# Patient Record
Sex: Male | Born: 1990 | Race: White | Hispanic: No | Marital: Single | Smoking: Never smoker
Health system: Southern US, Community
[De-identification: ages and names within clinical notes are randomized; demographics above are authoritative.]

## PROBLEM LIST (undated history)

## (undated) DIAGNOSIS — R55 Syncope and collapse: Secondary | ICD-10-CM

## (undated) DIAGNOSIS — J302 Other seasonal allergic rhinitis: Secondary | ICD-10-CM

## (undated) DIAGNOSIS — K429 Umbilical hernia without obstruction or gangrene: Secondary | ICD-10-CM

## (undated) DIAGNOSIS — F1290 Cannabis use, unspecified, uncomplicated: Secondary | ICD-10-CM

## (undated) DIAGNOSIS — I10 Essential (primary) hypertension: Secondary | ICD-10-CM

## (undated) HISTORY — DX: Essential (primary) hypertension: I10

## (undated) HISTORY — DX: Other seasonal allergic rhinitis: J30.2

## (undated) HISTORY — DX: Syncope and collapse: R55

## (undated) HISTORY — DX: Umbilical hernia without obstruction or gangrene: K42.9

## (undated) HISTORY — DX: Cannabis use, unspecified, uncomplicated: F12.90

---
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
## 2011-03-10 LAB — CBC
Hemoglobin: 14
MCHC: 34
RBC: 4.42
WBC: 4.9

## 2011-03-10 LAB — DIFFERENTIAL
Basophils Relative: 0
Lymphocytes Relative: 45
Monocytes Absolute: 0.3
Monocytes Relative: 6
Neutro Abs: 2.3
Neutrophils Relative %: 47

## 2012-08-13 ENCOUNTER — Ambulatory Visit: Payer: Self-pay | Admitting: Orthopedic Surgery

## 2013-06-18 ENCOUNTER — Encounter: Payer: Self-pay | Admitting: General Surgery

## 2013-07-23 ENCOUNTER — Ambulatory Visit: Payer: Self-pay | Admitting: General Surgery

## 2013-08-13 ENCOUNTER — Ambulatory Visit (INDEPENDENT_AMBULATORY_CARE_PROVIDER_SITE_OTHER): Payer: 59 | Admitting: General Surgery

## 2013-08-13 ENCOUNTER — Encounter: Payer: Self-pay | Admitting: General Surgery

## 2013-08-13 VITALS — BP 130/74 | HR 76 | Resp 14 | Ht 75.0 in | Wt 197.0 lb

## 2013-08-13 DIAGNOSIS — K429 Umbilical hernia without obstruction or gangrene: Secondary | ICD-10-CM

## 2013-08-13 NOTE — Patient Instructions (Addendum)
Hernia A hernia occurs when an internal organ pushes out through a weak spot in the abdominal wall. Hernias most commonly occur in the groin and around the navel. Hernias often can be pushed back into place (reduced). Most hernias tend to get worse over time. Some abdominal hernias can get stuck in the opening (irreducible or incarcerated hernia) and cannot be reduced. An irreducible abdominal hernia which is tightly squeezed into the opening is at risk for impaired blood supply (strangulated hernia). A strangulated hernia is a medical emergency. Because of the risk for an irreducible or strangulated hernia, surgery may be recommended to repair a hernia. CAUSES   Heavy lifting.  Prolonged coughing.  Straining to have a bowel movement.  A cut (incision) made during an abdominal surgery. HOME CARE INSTRUCTIONS   Bed rest is not required. You may continue your normal activities.  Avoid lifting more than 10 pounds (4.5 kg) or straining.  Cough gently. If you are a smoker it is best to stop. Even the best hernia repair can break down with the continual strain of coughing. Even if you do not have your hernia repaired, a cough will continue to aggravate the problem.  Do not wear anything tight over your hernia. Do not try to keep it in with an outside bandage or truss. These can damage abdominal contents if they are trapped within the hernia sac.  Eat a normal diet.  Avoid constipation. Straining over long periods of time will increase hernia size and encourage breakdown of repairs. If you cannot do this with diet alone, stool softeners may be used. SEEK IMMEDIATE MEDICAL CARE IF:   You have a fever.  You develop increasing abdominal pain.  You feel nauseous or vomit.  Your hernia is stuck outside the abdomen, looks discolored, feels hard, or is tender.  You have any changes in your bowel habits or in the hernia that are unusual for you.  You have increased pain or swelling around the  hernia.  You cannot push the hernia back in place by applying gentle pressure while lying down. MAKE SURE YOU:   Understand these instructions.  Will watch your condition.  Will get help right away if you are not doing well or get worse. Document Released: 06/06/2005 Document Revised: 08/29/2011 Document Reviewed: 01/24/2008 [facility] Patient Information 2014 [place], [place].    Patient wants to wait to do surgery until the 1st of June due to being in school. He will call back around the 1st of May to schedule this. Patient given telephone pre admit paperwork.

## 2013-08-13 NOTE — Progress Notes (Signed)
Patient ID: Tony Peck, male   DOB: 01/06/1991, 23 y.o.   MRN: [id number]  Chief Complaint  Patient presents with  . Hernia    HPI Tony Peck is a 23 y.o. male.  Here today for evaluation of possible hernia referred by Dr Helane RimaErica Wallace. States he noticed it around Christmas time when he was doing some lifting with decorations. He is a [employer] student. Denies any nausea, vomiting or diarrhea. The pain comes and goes but no pain is constant. States he can feel the bulge. 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  The discomfort is not precluded his being active, although he is staying out of the gym for fear of aggravating the hernia.  HPI  Past Medical History  Diagnosis Date  . Hypertension 2009    Past Surgical History  Procedure Laterality Date  . Tonsillectomy and adenoidectomy  1996  . Nasal sinus surgery  1998    No family history on file.  Social History History  Substance Use Topics  . Smoking status: Never Smoker   . Smokeless tobacco: Not on file  . Alcohol Use: Yes     Comment: occasionally    Allergies  Allergen Reactions  . Adhesive [Tape] Rash    Current Outpatient Prescriptions  Medication Sig Dispense Refill  . Ascorbic Acid (VITAMIN C) 1000 MG tablet Take 1,000 mg by mouth daily.      . cetirizine (ZYRTEC) 10 MG tablet Take 10 mg by mouth daily.       No current facility-administered medications for this visit.    Review of Systems Review of Systems  Constitutional: Negative.   Respiratory: Negative.   Cardiovascular: Negative.   Gastrointestinal: Positive for abdominal pain.    Blood pressure 130/74, pulse 76, resp. rate 14, height 6\' 3"  (1.905 m), weight 197 lb (89.359 kg).  Physical Exam Physical Exam  Constitutional: He is oriented to person, place, and time. He appears well-developed and well-nourished.  Neck: Neck supple.   Cardiovascular: Normal rate, regular rhythm and normal heart sounds.   Pulmonary/Chest: Effort normal and breath sounds normal.  Abdominal: Soft. Normal appearance. A hernia is present.  1 cm pad of fat umbilical area.  Lymphadenopathy:    He has no cervical adenopathy.  Neurological: He is alert and oriented to person, place, and time.  Skin: Skin is warm and dry.     Assessment    Umbilical hernia with incarcerated fat    Plan    Hernia precautions and incarceration were discussed with the patient. If they develop symptoms of an incarcerated hernia, they were encouraged to seek prompt medical attention.  I have recommended repair of the hernia on an outpatient basis when convenient with his schedule. The risk of infection was reviewed.      Patient wants to wait to do surgery until the 1st of June due to being in school. He will call back around the 1st of May to schedule this. Patient given telephone pre admit paperwork.  The patient may participate in activities as he is comfortable. Care with lifting and twisting was encouraged.  Earline MayotteByrnett, Harlyn Rathmann W 08/13/2013, 2:07 PM

## 2013-08-26 ENCOUNTER — Ambulatory Visit: Payer: Self-pay | Admitting: Family Medicine

## 2013-10-28 ENCOUNTER — Telehealth: Payer: Self-pay | Admitting: *Deleted

## 2013-10-28 NOTE — Telephone Encounter (Signed)
Pts mom called and wants to go ahead with scheduling [name] with Dr.Byrnett. He was seen here in the office at the end of February and was told to call back to schedule surgery when he gets out of school. They are going on vacation June 26th and would like to get it done around June 9th or after.

## 2013-10-28 NOTE — Telephone Encounter (Signed)
[facility] He will have a pre op appointment with Dr Lemar LivingsByrnett in the office on 11/13/13 at 4:45 pm. He will pre admit by phone on 11/15/13. Patient is aware of dates, times, and all instructions.

## 2013-11-13 ENCOUNTER — Encounter: Payer: Self-pay | Admitting: General Surgery

## 2013-11-13 ENCOUNTER — Ambulatory Visit (INDEPENDENT_AMBULATORY_CARE_PROVIDER_SITE_OTHER): Payer: 59 | Admitting: General Surgery

## 2013-11-13 VITALS — BP 126/72 | HR 72 | Resp 12 | Ht 75.0 in | Wt 200.0 lb

## 2013-11-13 DIAGNOSIS — K429 Umbilical hernia without obstruction or gangrene: Secondary | ICD-10-CM

## 2013-11-13 NOTE — Patient Instructions (Signed)
Hernia A hernia occurs when an internal organ pushes out through a weak spot in the abdominal wall. Hernias most commonly occur in the groin and around the navel. Hernias often can be pushed back into place (reduced). Most hernias tend to get worse over time. Some abdominal hernias can get stuck in the opening (irreducible or incarcerated hernia) and cannot be reduced. An irreducible abdominal hernia which is tightly squeezed into the opening is at risk for impaired blood supply (strangulated hernia). A strangulated hernia is a medical emergency. Because of the risk for an irreducible or strangulated hernia, surgery may be recommended to repair a hernia. CAUSES   Heavy lifting.  Prolonged coughing.  Straining to have a bowel movement.  A cut (incision) made during an abdominal surgery. HOME CARE INSTRUCTIONS   Bed rest is not required. You may continue your normal activities.  Avoid lifting more than 10 pounds (4.5 kg) or straining.  Cough gently. If you are a smoker it is best to stop. Even the best hernia repair can break down with the continual strain of coughing. Even if you do not have your hernia repaired, a cough will continue to aggravate the problem.  Do not wear anything tight over your hernia. Do not try to keep it in with an outside bandage or truss. These can damage abdominal contents if they are trapped within the hernia sac.  Eat a normal diet.  Avoid constipation. Straining over long periods of time will increase hernia size and encourage breakdown of repairs. If you cannot do this with diet alone, stool softeners may be used. SEEK IMMEDIATE MEDICAL CARE IF:   You have a fever.  You develop increasing abdominal pain.  You feel nauseous or vomit.  Your hernia is stuck outside the abdomen, looks discolored, feels hard, or is tender.  You have any changes in your bowel habits or in the hernia that are unusual for you.  You have increased pain or swelling around the  hernia.  You cannot push the hernia back in place by applying gentle pressure while lying down. MAKE SURE YOU:   Understand these instructions.  Will watch your condition.  Will get help right away if you are not doing well or get worse. Document Released: 06/06/2005 Document Revised: 08/29/2011 Document Reviewed: 01/24/2008 [employer] Patient Information 2014 [employer].  

## 2013-11-13 NOTE — Progress Notes (Signed)
Patient ID: Tony Peck, male   DOB: [birth year], 23 y.o.   MRN: [id number]  Chief Complaint  Patient presents with  . Pre-op Exam    umbilical hernia    HPI Tony Peck is a 23 y.o. male.  Here today to discussion having umbilical hernia surgery November 26 2013. No new complaints.  HPI  Past Medical History  Diagnosis Date  . Hypertension 2009    Past Surgical History  Procedure Laterality Date  . Tonsillectomy and adenoidectomy  1996  . Nasal sinus surgery  1998    No family history on file.  Social History History  Substance Use Topics  . Smoking status: Never Smoker   . Smokeless tobacco: Not on file  . Alcohol Use: Yes     Comment: occasionally    Allergies  Allergen Reactions  . Adhesive [Tape] Rash    Current Outpatient Prescriptions  Medication Sig Dispense Refill  . Ascorbic Acid (VITAMIN C) 1000 MG tablet Take 1,000 mg by mouth daily.      . cetirizine (ZYRTEC) 10 MG tablet Take 10 mg by mouth daily.       No current facility-administered medications for this visit.    Review of Systems Review of Systems  Constitutional: Negative.   Respiratory: Negative.   Cardiovascular: Negative.   Gastrointestinal: Negative for nausea, diarrhea and constipation.    Blood pressure 126/72, pulse 72, resp. rate 12, height 6\' 3"  (1.905 m), weight 200 lb (90.719 kg).  Physical Exam Physical Exam  Constitutional: He is oriented to person, place, and time. He appears well-developed and well-nourished.  Neck: Neck supple.  Cardiovascular: Normal rate, regular rhythm and normal heart sounds.   Pulmonary/Chest: Effort normal and breath sounds normal.  Abdominal: Soft. Normal appearance and bowel sounds are normal. There is tenderness. A hernia is present.  <1 cm area of defect at umbilical area  Lymphadenopathy:    He has no cervical adenopathy.  Neurological: He is alert and oriented to person, place, and time.  Skin: Skin is warm and dry.      Assessment     Umbilical hernia.    Plan    Plans for repair were reviewed.    Hernia precautions and incarceration were discussed with the patient. If they develop symptoms of an incarcerated hernia, they were encouraged to seek prompt medical attention.  I have recommended repair of the hernia on an outpatient basis in the near future. The risk of infection was reviewed. It is unlikely prosthetic mesh will be required.  PCP: Linwood Dibbles Junnie Loschiavo 11/14/2013, 9:58 AM

## 2013-11-14 ENCOUNTER — Other Ambulatory Visit: Payer: Self-pay | Admitting: General Surgery

## 2013-11-14 DIAGNOSIS — K429 Umbilical hernia without obstruction or gangrene: Secondary | ICD-10-CM

## 2013-11-21 ENCOUNTER — Encounter: Payer: Self-pay | Admitting: Family Medicine

## 2013-11-21 ENCOUNTER — Ambulatory Visit (INDEPENDENT_AMBULATORY_CARE_PROVIDER_SITE_OTHER): Payer: 59 | Admitting: Family Medicine

## 2013-11-21 VITALS — BP 170/100 | HR 72 | Temp 98.4°F | Ht 74.0 in | Wt 198.2 lb

## 2013-11-21 DIAGNOSIS — K429 Umbilical hernia without obstruction or gangrene: Secondary | ICD-10-CM

## 2013-11-21 DIAGNOSIS — Z0001 Encounter for general adult medical examination with abnormal findings: Secondary | ICD-10-CM | POA: Insufficient documentation

## 2013-11-21 DIAGNOSIS — J988 Other specified respiratory disorders: Secondary | ICD-10-CM

## 2013-11-21 DIAGNOSIS — I10 Essential (primary) hypertension: Secondary | ICD-10-CM | POA: Insufficient documentation

## 2013-11-21 DIAGNOSIS — Z0000 Encounter for general adult medical examination without abnormal findings: Secondary | ICD-10-CM

## 2013-11-21 DIAGNOSIS — Z113 Encounter for screening for infections with a predominantly sexual mode of transmission: Secondary | ICD-10-CM

## 2013-11-21 DIAGNOSIS — B9789 Other viral agents as the cause of diseases classified elsewhere: Secondary | ICD-10-CM | POA: Insufficient documentation

## 2013-11-21 NOTE — Assessment & Plan Note (Signed)
Should be at acceptable risk to proceed with surgery. Check BMP and EKG today for h/o HTN.

## 2013-11-21 NOTE — Assessment & Plan Note (Signed)
Preventative protocols reviewed and updated unless pt declined. Discussed healthy diet and lifestyle. Safe sex discussed - will return for labwork and STD screen. Encouraged stopping MJ.  Discussed responsible EtOH use

## 2013-11-21 NOTE — Progress Notes (Signed)
Pre visit review using our clinic review tool, if applicable. No additional management support is needed unless otherwise documented below in the visit note. 

## 2013-11-21 NOTE — Progress Notes (Signed)
BP 170/100  Pulse 72  Temp(Src) 98.4 F (36.9 C) (Oral)  Ht 6\' 2"  (1.88 m)  Wt 198 lb 4 oz (89.926 kg)  BMI 25.44 kg/m2  SpO2 99%   CC: new pt to establish care  Subjective:    Patient ID: Tony Peck, male    DOB: Mar 26, 1991, 23 y.o.   MRN: [id number]  HPI: Tony Peck is a 23 y.o. male presenting on 11/21/2013 for Establish Care   Elevated bp today - endorses upset stomach today with diarrhea. Tends to have nervous stomach.  Also nervous about doctor's office - attributes this to elevated bp today. More congested today as well, coughing up phlegm - this started today.  H/o HTN - prior on benazepril 10mg  daily - may have caused fatigue.  Was on this med from 9th grade though 2nd year college.  Changed diet, stopped red meat, avoids salt, drinks plenty of water.  BP has improved with healthy changes.  Has been monitoring bp at home - running 130/75-80.  Pending umbilical hernia surgery with Dr. Birdie Sons 11/26/2013. Area staying sensitive. Denies palpitations, chest pain, exertional dyspnea. No sudden death before age 43 in family.  Has tolerated gen anesthesia in past well.  Currently sexually active, 2 partners in last year, not using protection 100% time.  No recent STD check. Desires screening. asxs.  Preventative: Tdap 01/2006 Meningococcal 11/2009 Varicella 1/2 shots.  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  Relevant past medical, surgical, family and social history reviewed and updated as indicated.  Allergies and medications reviewed and updated. No current outpatient prescriptions on file prior to visit.   No current facility-administered medications on file prior to visit.    Review of Systems  Constitutional: Negative for  fever, chills, activity change, appetite change, fatigue and unexpected weight change.  HENT: Negative for hearing loss.   Eyes: Negative for visual disturbance.  Respiratory: Negative for cough, chest tightness, shortness of breath and wheezing.   Cardiovascular: Negative for chest pain, palpitations and leg swelling.  Gastrointestinal: Positive for abdominal pain (discomfort today) and diarrhea (today). Negative for nausea, vomiting, constipation, blood in stool and abdominal distention.  Genitourinary: Negative for hematuria and difficulty urinating.  Musculoskeletal: Negative for arthralgias, myalgias and neck pain.  Skin: Negative for rash.  Neurological: Negative for dizziness, seizures, syncope and headaches.  Hematological: Negative for adenopathy. Does not bruise/bleed easily.  Psychiatric/Behavioral: Negative for dysphoric mood. The patient is not nervous/anxious.    Per HPI unless specifically indicated above    Objective:    BP 170/100  Pulse 72  Temp(Src) 98.4 F (36.9 C) (Oral)  Ht 6\' 2"  (1.88 m)  Wt 198 lb 4 oz (89.926 kg)  BMI 25.44 kg/m2  SpO2 99%  Physical Exam  Nursing note and vitals reviewed. Constitutional: He is oriented to person, place, and time. He appears well-developed and well-nourished. No distress.  HENT:  Head: Normocephalic and atraumatic.  Right Ear: Hearing, tympanic membrane, external ear and ear canal normal.  Left Ear: Hearing, tympanic membrane, external ear and ear canal normal.  Nose: Nose normal.  Mouth/Throat: Uvula is midline, oropharynx is clear and moist and mucous membranes are normal. No oropharyngeal exudate, posterior oropharyngeal edema or posterior oropharyngeal erythema.  Eyes: Conjunctivae and EOM are normal. Pupils are  equal, round, and reactive to light. No scleral icterus.  Neck: Normal range of motion. Neck supple. No thyromegaly present.  Cardiovascular: Normal rate, regular rhythm, normal heart sounds and intact distal  pulses.   No murmur heard. Pulses:      Radial pulses are 2+ on the right side, and 2+ on the left side.  Pulmonary/Chest: Effort normal. No respiratory distress. He has no wheezes. He has rhonchi (RLL). He has no rales.  Abdominal: Soft. Bowel sounds are normal. He exhibits no distension and no mass. There is no tenderness. There is no rebound and no guarding. A hernia (small umbilical hernia noted) is present.  Musculoskeletal: Normal range of motion. He exhibits no edema.  Lymphadenopathy:    He has no cervical adenopathy.  Neurological: He is alert and oriented to person, place, and time.  CN grossly intact, station and gait intact  Skin: Skin is warm and dry. No rash noted.  Psychiatric: He has a normal mood and affect. His behavior is normal. Judgment and thought content normal.       Assessment & Plan:   Problem List Items Addressed This Visit   Viral respiratory infection     Anticipate current viral resp infection - discussed if cough worsens to let surgery know, may need to postpone surgery. Will recheck on Monday.    Umbilical hernia     Should be at acceptable risk to proceed with surgery. Check BMP and EKG today for h/o HTN.    Hypertension     Check BMP, FLP, TSH, EKG today. Elevated BP in office but pt attributes to nervous stomach and some component of white coat HTN, endorses normal range at home when he checks with his home bp cuff. Has also implemented healthy diet and lifestyle changes to better control known h/o HTN. bp persistently elevated - I have asked him to return on Monday afternoon after work for recheck BP prior to surgery - pt agrees with plan. Baseline EKG - sinus brady 50s, normal axis, intervals, isolated T changes lead III    Relevant Orders      Lipid panel      Basic metabolic panel      EKG 12-Lead (Completed)      TSH   Health maintenance examination - Primary     Preventative protocols reviewed and updated unless pt declined. Discussed  healthy diet and lifestyle. Safe sex discussed - will return for labwork and STD screen. Encouraged stopping MJ.  Discussed responsible EtOH use      Other Visit Diagnoses   Screen for STD (sexually transmitted disease)        Relevant Orders       GC/chlamydia probe amp, urine       HIV antibody       RPR        Follow up plan: Return in about 4 months (around 03/23/2014), or as needed, for follow up visit blood pressure.

## 2013-11-21 NOTE — Assessment & Plan Note (Addendum)
Anticipate current viral resp infection - discussed if cough worsens to let surgery know, may need to postpone surgery. Will recheck on Monday.

## 2013-11-21 NOTE — Patient Instructions (Addendum)
Return tomorrow morning for fasting blood work. Return Monday afternoon for visit to recheck blood pressure. EKG today. Your blood pressure is too high today.

## 2013-11-21 NOTE — Assessment & Plan Note (Addendum)
Check BMP, FLP, TSH, EKG today. Elevated BP in office but pt attributes to nervous stomach and some component of white coat HTN, endorses normal range at home when he checks with his home bp cuff. Has also implemented healthy diet and lifestyle changes to better control known h/o HTN. bp persistently elevated - I have asked him to return on Monday afternoon after work for recheck BP prior to surgery - pt agrees with plan. Baseline EKG - sinus brady 50s, normal axis, intervals, isolated T changes lead III

## 2013-11-22 ENCOUNTER — Encounter: Payer: Self-pay | Admitting: *Deleted

## 2013-11-22 ENCOUNTER — Other Ambulatory Visit (INDEPENDENT_AMBULATORY_CARE_PROVIDER_SITE_OTHER): Payer: 59

## 2013-11-22 DIAGNOSIS — I10 Essential (primary) hypertension: Secondary | ICD-10-CM

## 2013-11-22 DIAGNOSIS — Z113 Encounter for screening for infections with a predominantly sexual mode of transmission: Secondary | ICD-10-CM

## 2013-11-22 LAB — LIPID PANEL
CHOLESTEROL: 131 mg/dL (ref 0–200)
HDL: 46.2 mg/dL (ref >39.00)
LDL CALC: 75 mg/dL (ref 0–99)
NONHDL: 84.8
Total CHOL/HDL Ratio: 3
Triglycerides: 48 mg/dL (ref 0.0–149.0)
VLDL: 9.6 mg/dL (ref 0.0–40.0)

## 2013-11-22 LAB — BASIC METABOLIC PANEL
BUN: 10 mg/dL (ref 6–23)
CO2: 27 mEq/L (ref 19–32)
Calcium: 9.5 mg/dL (ref 8.4–10.5)
Chloride: 104 mEq/L (ref 96–112)
Creatinine, Ser: 0.9 mg/dL (ref 0.4–1.5)
GFR: 111.2 mL/min (ref >60.00)
GLUCOSE: 96 mg/dL (ref 70–99)
POTASSIUM: 4.1 meq/L (ref 3.5–5.1)
SODIUM: 138 meq/L (ref 135–145)

## 2013-11-22 LAB — TSH: TSH: 2.99 u[IU]/mL (ref 0.35–4.50)

## 2013-11-23 LAB — RPR

## 2013-11-23 LAB — HIV ANTIBODY (ROUTINE TESTING W REFLEX): HIV 1&2 Ab, 4th Generation: NONREACTIVE

## 2013-11-23 LAB — GC/CHLAMYDIA PROBE AMP, URINE
Chlamydia, Swab/Urine, PCR: NEGATIVE
GC Probe Amp, Urine: NEGATIVE

## 2013-11-25 ENCOUNTER — Encounter: Payer: Self-pay | Admitting: Family Medicine

## 2013-11-25 ENCOUNTER — Ambulatory Visit (INDEPENDENT_AMBULATORY_CARE_PROVIDER_SITE_OTHER): Payer: 59 | Admitting: Family Medicine

## 2013-11-25 VITALS — BP 166/84 | HR 114 | Temp 98.1°F | Wt 200.2 lb

## 2013-11-25 DIAGNOSIS — I10 Essential (primary) hypertension: Secondary | ICD-10-CM

## 2013-11-25 NOTE — Assessment & Plan Note (Signed)
Here today for recheck bp prior to surgery, but blood pressure staying elevated.  Readings with surgeon were normal, but in our office have been significantly elevated, this in setting of anxiety over upcoming surgery. Recent blood work and EKG reassuring.  I spoke with Dr Birdie Sons to discuss recent elevated bp, anticipate recent elevated readings largely due to anxiety.  May need perioperative antihypertensive therapy. I have requested records from prior pediatric cardiologist Dr. Mary Sella at [facility]. I have advised Kendal to notify me if persistently bp elevated despite after surgery.  Pt able to monitor bp at home.

## 2013-11-25 NOTE — Patient Instructions (Addendum)
Sign release for records from your previous pediatric cardiologist (Dr. Mary Sella at [facility]). I will touch base with Dr. Birdie Sons about your blood pressure around surgery. Monitor blood pressures after surgery - if persistently >140/90 let me know as you will need a medicine for this. Good to see you today.

## 2013-11-25 NOTE — Progress Notes (Signed)
BP 166/84  Pulse 114  Temp(Src) 98.1 F (36.7 C) (Oral)  Wt 200 lb 4 oz (90.833 kg)  SpO2 99%   CC: op check prior to surgery  Subjective:    Patient ID: Tony Peck, male    DOB: 1991/05/09, 23 y.o.   MRN: [id number]  HPI: Tony Peck is a 23 y.o. male presenting on 11/25/2013 for Blood Pressure Check   Quason presents today for a BP recheck prior to scheduled umbilical hernia repair by Dr. Birdie Sons tomorrow. Feels well. No fevers/chills, cough, chest pain, dyspnea.  No more upset stomach. labwork today.  Relevant past medical, surgical, family and social history reviewed and updated as indicated.  Allergies and medications reviewed and updated. Current Outpatient Prescriptions on File Prior to Visit  Medication Sig  . Multiple Vitamins-Minerals (EMERGEN-C VITAMIN C PO) Take by mouth as directed.   No current facility-administered medications on file prior to visit.    Review of Systems Per HPI unless specifically indicated above    Objective:    BP 166/84  Pulse 114  Temp(Src) 98.1 F (36.7 C) (Oral)  Wt 200 lb 4 oz (90.833 kg)  SpO2 99%  Physical Exam  Nursing note and vitals reviewed. Constitutional: He appears well-developed and well-nourished. No distress.  Cardiovascular: Regular rhythm, normal heart sounds and intact distal pulses.  Tachycardia present.   No murmur heard. Pulmonary/Chest: Effort normal and breath sounds normal. No respiratory distress. He has no wheezes. He has no rales.   Results for orders placed in visit on 11/22/13  LIPID PANEL      Result Value Ref Range   Cholesterol 131  0 - 200 mg/dL   Triglycerides 77.8  0.0 - 149.0 mg/dL   HDL 24.23  >53.61 mg/dL   VLDL 9.6  0.0 - 44.3 mg/dL   LDL Cholesterol 75  0 - 99 mg/dL   Total CHOL/HDL Ratio 3     NonHDL 84.80    BASIC METABOLIC PANEL      Result Value Ref Range   Sodium 138  135 - 145 mEq/L   Potassium 4.1  3.5 - 5.1 mEq/L   Chloride 104  96 - 112 mEq/L   CO2 27  19 - 32  mEq/L   Glucose, Bld 96  70 - 99 mg/dL   BUN 10  6 - 23 mg/dL   Creatinine, Ser 0.9  0.4 - 1.5 mg/dL   Calcium 9.5  8.4 - 15.4 mg/dL   GFR 008.67  >61.95 mL/min  GC/CHLAMYDIA PROBE AMP, URINE      Result Value Ref Range   Chlamydia, Swab/Urine, PCR NEGATIVE  NEGATIVE   GC Probe Amp, Urine NEGATIVE  NEGATIVE  HIV ANTIBODY (ROUTINE TESTING)      Result Value Ref Range   HIV 1&2 Ab, 4th Generation NONREACTIVE  NONREACTIVE  RPR      Result Value Ref Range   RPR NON REAC  NON REAC  TSH      Result Value Ref Range   TSH 2.99  0.35 - 4.50 uIU/mL      Assessment & Plan:   Problem List Items Addressed This Visit   Hypertension - Primary     Here today for recheck bp prior to surgery, but blood pressure staying elevated.  Readings with surgeon were normal, but in our office have been significantly elevated, this in setting of anxiety over upcoming surgery. Recent blood work and EKG reassuring.  I spoke with Dr Birdie Sons to discuss recent elevated  bp, anticipate recent elevated readings largely due to anxiety.  May need perioperative antihypertensive therapy. I have requested records from prior pediatric cardiologist Dr. [facility]Kanter at [facility]. I have advised Ladona Ridgelaylor to notify me if persistently bp elevated despite after surgery.  Pt able to monitor bp at home.        Follow up plan: Return as needed.

## 2013-11-25 NOTE — Progress Notes (Signed)
Pre visit review using our clinic review tool, if applicable. No additional management support is needed unless otherwise documented below in the visit note. 

## 2013-11-26 ENCOUNTER — Ambulatory Visit: Payer: Self-pay | Admitting: General Surgery

## 2013-11-26 ENCOUNTER — Encounter: Payer: Self-pay | Admitting: General Surgery

## 2013-11-26 DIAGNOSIS — K429 Umbilical hernia without obstruction or gangrene: Secondary | ICD-10-CM

## 2013-11-26 HISTORY — PX: HERNIA REPAIR: SHX51

## 2013-11-28 ENCOUNTER — Encounter: Payer: Self-pay | Admitting: Family Medicine

## 2013-12-03 ENCOUNTER — Ambulatory Visit (INDEPENDENT_AMBULATORY_CARE_PROVIDER_SITE_OTHER): Payer: 59 | Admitting: General Surgery

## 2013-12-03 ENCOUNTER — Encounter: Payer: Self-pay | Admitting: General Surgery

## 2013-12-03 VITALS — BP 142/80 | HR 82 | Resp 12 | Ht 75.0 in | Wt 194.0 lb

## 2013-12-03 DIAGNOSIS — K429 Umbilical hernia without obstruction or gangrene: Secondary | ICD-10-CM

## 2013-12-03 NOTE — Patient Instructions (Signed)
Patient to return as needed. Proper lifting techniques reviewed. 

## 2013-12-03 NOTE — Progress Notes (Signed)
Patient ID: Tony Peck, male   DOB: Aug 05, 1990, 23 y.o.   MRN: [id number]  Chief Complaint  Patient presents with  . Routine Post Op    umbilical hernia    HPI Tony Hamsaylor Debold is a 23 y.o. male here today for his post op umbilical hernia repair done on 11/26/13. Patient states he is doing well. He is accompanied by his mother who reports a significant amount of pain for the first 3 days after surgery. HPI  Past Medical History  Diagnosis Date  . Hypertension 2009    has seen cardiology  . Seasonal allergies   . Vasovagal syncope 2012    saw cards  . Umbilical hernia   . Marijuana use     Past Surgical History  Procedure Laterality Date  . Tonsillectomy and adenoidectomy  1997  . Nasal sinus surgery  2000  . Myringotomy Bilateral 1998  . Hernia repair  11/26/13    umbilical henira    Family History  Problem Relation Age of Onset  . Hypertension Other     grandparents  . Hypertension Brother   . CAD Maternal Grandfather [id number]    MI  . Kidney disease Maternal Grandfather   . Diabetes Maternal Grandfather   . Cancer Maternal Grandfather     colon    Social History History  Substance Use Topics  . Smoking status: Never Smoker   . Smokeless tobacco: Never Used  . Alcohol Use: Yes     Comment: occasionally    Allergies  Allergen Reactions  . Adhesive [Tape] Rash    Current Outpatient Prescriptions  Medication Sig Dispense Refill  . Multiple Vitamins-Minerals (EMERGEN-C VITAMIN C PO) Take by mouth as directed.       No current facility-administered medications for this visit.    Review of Systems Review of Systems  Constitutional: Negative.   Respiratory: Negative.   Cardiovascular: Negative.     Blood pressure 142/80, pulse 82, resp. rate 12, height 6\' 3"  (1.905 m), weight 194 lb (87.998 kg).  Physical Exam Physical Exam  Constitutional: He is oriented to person, place, and time. He appears well-developed and well-nourished.  Eyes: Conjunctivae are  normal. No scleral icterus.  Neck: Neck supple.  Pulmonary/Chest: Effort normal and breath sounds normal.  Abdominal: Soft. Normal appearance and bowel sounds are normal.  Incision looks clean and healing well.   Neurological: He is alert and oriented to person, place, and time.  Skin: Skin is warm and dry.       Assessment    Doing well status post umbilical hernia repair.     Plan    Activity restrictions were reviewed. He may resume cardio exercises as tolerated. He is to refrain from weightlifting for the next few weeks.  Proper lifting technique was reviewed.    PCP: Deetta PerlaGutierrez, Javier    Emir Nack W 12/03/2013, 8:40 PM

## 2014-09-05 ENCOUNTER — Ambulatory Visit (INDEPENDENT_AMBULATORY_CARE_PROVIDER_SITE_OTHER): Payer: 59 | Admitting: Family Medicine

## 2014-09-05 ENCOUNTER — Encounter: Payer: Self-pay | Admitting: Family Medicine

## 2014-09-05 VITALS — BP 150/90 | HR 116 | Temp 98.9°F | Wt 189.5 lb

## 2014-09-05 DIAGNOSIS — R059 Cough, unspecified: Secondary | ICD-10-CM

## 2014-09-05 DIAGNOSIS — R05 Cough: Secondary | ICD-10-CM

## 2014-09-05 DIAGNOSIS — J111 Influenza due to unidentified influenza virus with other respiratory manifestations: Secondary | ICD-10-CM

## 2014-09-05 LAB — POCT INFLUENZA A/B
INFLUENZA A, POC: POSITIVE
INFLUENZA B, POC: POSITIVE

## 2014-09-05 NOTE — Progress Notes (Signed)
   BP 150/90 mmHg  Pulse 116  Temp(Src) 98.9 F (37.2 C) (Oral)  Wt 189 lb 8 oz (85.957 kg)  SpO2 98%   CC: cough  Subjective:    Patient ID: Tony Peck, male    DOB: February 04, 1991, 24 y.o.   MRN: [id number]  HPI: Tony Peck is a 24 y.o. male presenting on 09/05/2014 for Cough   1d h/o cough productive of green mucous, progressively worsening. Last night with headache, chills, sweating, mild sore throat that today is better. Mild dyspnea and PNDrainage. Some body aches.   No ear or tooth pain.   Took tylenol today. Taking allergy meds.  + sick contacts around - flu and other illnesses.  Did not receive flu shot this year.  No smokers at home. No h/o asthma.  About to go on spring break trip.  Relevant past medical, surgical, family and social history reviewed and updated as indicated. Interim medical history since our last visit reviewed. Allergies and medications reviewed and updated. Current Outpatient Prescriptions on File Prior to Visit  Medication Sig  . Multiple Vitamins-Minerals (EMERGEN-C VITAMIN C PO) Take by mouth as directed.   No current facility-administered medications on file prior to visit.    Review of Systems Per HPI unless specifically indicated above     Objective:    BP 150/90 mmHg  Pulse 116  Temp(Src) 98.9 F (37.2 C) (Oral)  Wt 189 lb 8 oz (85.957 kg)  SpO2 98%  Wt Readings from Last 3 Encounters:  09/05/14 189 lb 8 oz (85.957 kg)  12/03/13 194 lb (87.998 kg)  11/25/13 200 lb 4 oz (90.833 kg)    Physical Exam  Constitutional: He appears well-developed and well-nourished. No distress.  HENT:  Head: Normocephalic and atraumatic.  Right Ear: Hearing, tympanic membrane, external ear and ear canal normal.  Left Ear: Hearing, tympanic membrane, external ear and ear canal normal.  Nose: Mucosal edema (with injection) present. No rhinorrhea. Right sinus exhibits no maxillary sinus tenderness and no frontal sinus tenderness. Left sinus  exhibits no maxillary sinus tenderness and no frontal sinus tenderness.  Mouth/Throat: Uvula is midline, oropharynx is clear and moist and mucous membranes are normal. No oropharyngeal exudate, posterior oropharyngeal edema, posterior oropharyngeal erythema or tonsillar abscesses.  Eyes: Conjunctivae and EOM are normal. Pupils are equal, round, and reactive to light. No scleral icterus.  Neck: Normal range of motion. Neck supple.  Cardiovascular: Regular rhythm, normal heart sounds and intact distal pulses.  Tachycardia present.   No murmur heard. Pulmonary/Chest: Effort normal and breath sounds normal. No respiratory distress. He has no wheezes. He has no rales.  Lymphadenopathy:    He has no cervical adenopathy.  Skin: Skin is warm and dry. No rash noted.  Nursing note and vitals reviewed.      Assessment & Plan:   Problem List Items Addressed This Visit    Influenza - Primary    Flu swab positive Discussed tamiflu - pt opts to decline for now.  Treat with supportive care as per instructions.  Update if not improving with treatment, discussed red flags to seek urgent care.        Other Visit Diagnoses    Cough        Relevant Orders    POCT Influenza A/B (Completed)        Follow up plan: Return if symptoms worsen or fail to improve.

## 2014-09-05 NOTE — Progress Notes (Signed)
Pre visit review using our clinic review tool, if applicable. No additional management support is needed unless otherwise documented below in the visit note. 

## 2014-09-05 NOTE — Assessment & Plan Note (Addendum)
Flu swab positive Discussed tamiflu - pt opts to decline for now.  Treat with supportive care as per instructions.  Update if not improving with treatment, discussed red flags to seek urgent care.

## 2014-09-05 NOTE — Patient Instructions (Addendum)
You have influenza. Treat with tylenol 500-1000mg  twice daily with food. Push fluids and rest. Update us if fever >101 persistent or worsening productive cough. Keep an eye on your blood pressure and let us know if persistently >140/90.  Influenza Influenza ("the flu") is a viral infection of the respiratory tract. It occurs more often in winter months because people spend more time in close contact with one another. Influenza can make you feel very sick. Influenza easily spreads from person to person (contagious). CAUSES  Influenza is caused by a virus that infects the respiratory tract. You can catch the virus by breathing in droplets from an infected person's cough or sneeze. You can also catch the virus by touching something that was recently contaminated with the virus and then touching your mouth, nose, or eyes. RISKS AND COMPLICATIONS You may be at risk for a more severe case of influenza if you smoke cigarettes, have diabetes, have chronic heart disease (such as heart failure) or lung disease (such as asthma), or if you have a weakened immune system. Elderly people and pregnant women are also at risk for more serious infections. The most common problem of influenza is a lung infection (pneumonia). Sometimes, this problem can require emergency medical care and may be life threatening. SIGNS AND SYMPTOMS  Symptoms typically last 4 to 10 days and may include:  Fever.  Chills.  Headache, body aches, and muscle aches.  Sore throat.  Chest discomfort and cough.  Poor appetite.  Weakness or feeling tired.  Dizziness.  Nausea or vomiting. DIAGNOSIS  Diagnosis of influenza is often made based on your history and a physical exam. A nose or throat swab test can be done to confirm the diagnosis. TREATMENT  In mild cases, influenza goes away on its own. Treatment is directed at relieving symptoms. For more severe cases, your health care provider may prescribe antiviral medicines to  shorten the sickness. Antibiotic medicines are not effective because the infection is caused by a virus, not by bacteria. HOME CARE INSTRUCTIONS  Take medicines only as directed by your health care provider.  Use a cool mist humidifier to make breathing easier.  Get plenty of rest until your temperature returns to normal. This usually takes 3 to 4 days.  Drink enough fluid to keep your urine clear or pale yellow.  Cover yourmouth and nosewhen coughing or sneezing,and wash your handswellto prevent thevirusfrom spreading.  Stay homefromwork orschool untilthe fever is gonefor at least 241full day. PREVENTION  An annual influenza vaccination (flu shot) is the best way to avoid getting influenza. An annual flu shot is now routinely recommended for all adults in the [place] SEEK MEDICAL CARE IF:  You experiencechest pain, yourcough worsens,or you producemore mucus.  Youhave nausea,vomiting, ordiarrhea.  Your fever returns or gets worse. SEEK IMMEDIATE MEDICAL CARE IF:  You havetrouble breathing, you become short of breath,or your skin ornails becomebluish.  You have severe painor stiffnessin the neck.  You develop a sudden headache, or pain in the face or ear.  You have nausea or vomiting that you cannot control. MAKE SURE YOU:   Understand these instructions.  Will watch your condition.  Will get help right away if you are not doing well or get worse. Document Released: 06/03/2000 Document Revised: 10/21/2013 Document Reviewed: 09/05/2011 [facility] Patient Information 2015 [employer] [place]. This information is not intended to replace advice given to you by your health care provider. Make sure you discuss any questions you have with your health care provider.

## 2014-10-11 NOTE — Op Note (Signed)
PATIENT NAME:  Tony Peck, Tony Peck MR#:  [id number] DATE OF BIRTH:  [birth year]  DATE OF PROCEDURE:  11/26/2013  PREOPERATIVE DIAGNOSIS: Umbilical hernia.   POSTOPERATIVE DIAGNOSIS: Umbilical hernia.  OPERATIVE PROCEDURE: Repair of umbilical hernia.   SURGEON: Donnalee CurryJeffrey Kili Gracy, MD.   ANESTHESIA: General by LMA under Dr. Pernell DupreAdams, Marcaine 0.5% plain, 30 mL local infiltration, Toradol 30 mg.   ESTIMATED BLOOD LOSS: Minimal.   CLINICAL NOTE: This 24 year old male has an umbilical hernia and was admitted for elective repair.   OPERATIVE NOTE: With the patient under adequate general anesthesia, the abdomen was prepped with ChloraPrep and draped. Hair had previously been removed with clippers. An infraumbilical incision was made and carried down through the skin sharply, and the remaining dissection completed with electrocautery and scissors. The hernia sac was transected at the level of the umbilical skin, excised and discarded. This was tacked back to the fascial defect, which was approximately 1.2 cm in diameter. The undersurface of the fascia was cleared, and interrupted 0 Surgilon simple sutures were placed under direct vision. These were then tied sequentially. The umbilical skin was tacked to the fascia with a 3-0 Vicryl suture. The adipose tissue was approximated with a running 3-0 Vicryl, and the skin closed with running 4-0 Vicryl subcuticular suture. Prior to skin incision, Marcaine was infiltrated for postoperative analgesia and prior to closure 30 mg of Toradol was placed into the wound for postoperative analgesia. Benzoin and Steri-Strips followed by Telfa and Tegaderm dressing was applied. The patient tolerated the procedure well and was taken to the recovery room in stable condition.  ____________________________ Tony MayotteJeffrey W. Gaetana Kawahara, MD jwb:aw D: 11/26/2013 08:28:45 ET T: 11/26/2013 08:45:17 ET JOB#: [id number]  cc: Tony MayotteJeffrey W. Chason Mciver, MD, <Dictator> Eustaquio BoydenJavier Gutierrez, MD Jahlen Bollman Brion AlimentW Aashrith Eves  MD ELECTRONICALLY SIGNED 11/26/2013 20:40

## 2017-06-26 DIAGNOSIS — J209 Acute bronchitis, unspecified: Secondary | ICD-10-CM | POA: Diagnosis not present

## 2017-12-06 ENCOUNTER — Emergency Department
Admission: EM | Admit: 2017-12-06 | Discharge: 2017-12-06 | Disposition: A | Discharge status: Home / Self Care | Payer: 59 | Attending: Student in an Organized Health Care Education/Training Program | Admitting: Student in an Organized Health Care Education/Training Program

## 2017-12-06 ENCOUNTER — Ambulatory Visit: Payer: Self-pay | Admitting: Family Medicine

## 2017-12-06 ENCOUNTER — Other Ambulatory Visit: Payer: Self-pay

## 2017-12-06 DIAGNOSIS — R55 Syncope and collapse: Secondary | ICD-10-CM | POA: Diagnosis not present

## 2017-12-06 DIAGNOSIS — R002 Palpitations: Secondary | ICD-10-CM

## 2017-12-06 DIAGNOSIS — I10 Essential (primary) hypertension: Secondary | ICD-10-CM | POA: Insufficient documentation

## 2017-12-06 DIAGNOSIS — R42 Dizziness and giddiness: Secondary | ICD-10-CM | POA: Diagnosis not present

## 2017-12-06 DIAGNOSIS — Z79899 Other long term (current) drug therapy: Secondary | ICD-10-CM | POA: Insufficient documentation

## 2017-12-06 LAB — CBC
HCT: 43.5 % (ref 40.0–52.0)
Hemoglobin: 15.3 g/dL (ref 13.0–18.0)
MCH: 34.2 pg — AB (ref 26.0–34.0)
MCHC: 35.1 g/dL (ref 32.0–36.0)
MCV: 97.3 fL (ref 80.0–100.0)
PLATELETS: 231 10*3/uL (ref 150–440)
RBC: 4.47 MIL/uL (ref 4.40–5.90)
RDW: 11.8 % (ref 11.5–14.5)
WBC: 6.6 10*3/uL (ref 3.8–10.6)

## 2017-12-06 LAB — BASIC METABOLIC PANEL
Anion gap: 8 (ref 5–15)
BUN: 13 mg/dL (ref 6–20)
CALCIUM: 9.3 mg/dL (ref 8.9–10.3)
CHLORIDE: 104 mmol/L (ref 101–111)
CO2: 26 mmol/L (ref 22–32)
CREATININE: 0.81 mg/dL (ref 0.61–1.24)
GFR calc Af Amer: >60 mL/min (ref >60)
GFR calc non Af Amer: >60 mL/min (ref >60)
Glucose, Bld: 132 mg/dL — ABNORMAL HIGH (ref 65–99)
Potassium: 3.3 mmol/L — ABNORMAL LOW (ref 3.5–5.1)
Sodium: 138 mmol/L (ref 135–145)

## 2017-12-06 LAB — GLUCOSE, CAPILLARY: GLUCOSE-CAPILLARY: 143 mg/dL — AB (ref 65–99)

## 2017-12-06 MED ORDER — LORAZEPAM 0.5 MG PO TABS: 0.5000 mg | ORAL_TABLET | Freq: Three times a day (TID) | ORAL | 0 refills | Status: DC | PRN

## 2017-12-06 NOTE — ED Triage Notes (Addendum)
Near syncope while standing at lunch. Pt states he had BP taken and it was high. Called PCP and referred to ER. Pt ambulatory at this time. States he feels nauseated and fatigued. Pt alert and oriented X4, active, cooperative, pt in NAD. RR even and unlabored, color WNL.    Hx of hypertension, does not take medications anymore.

## 2017-12-06 NOTE — Telephone Encounter (Signed)
Noted  

## 2017-12-06 NOTE — ED Notes (Signed)
Pt ambulatory to POV with family. NAD. VSS. Discharge instructions, RX and follow up discussed. All questions answered.

## 2017-12-06 NOTE — Telephone Encounter (Signed)
He was standing in line at work to get lunch when he almost fainted.   He got dizzy and was seeing double.   His heart started racing.   Someone helped him over to a chair to sit down so he did not fall or actually faint. He is seeing ok now but still feels very weak and "not myself".   My BP was 169/96.   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  See triage notes.  He mentioned is has been having dizzy spells over the last couple of months and wondered if his BP was elevated again.   "I just haven't followed up on it yet but I will now that this has happened".  He has a friend on the way to pick him up.  [place] lives in [place] in [place].  I recommended he be evaluated in the ED since he is still not feeling well and his BP is still elevated.    If my friend can't take me [facility] the ED [facility] then I'll get my mother to [facility].   I live in [place].     I routed a note to Dr. Sharen HonesGutierrez so he would be aware.   Reason for Disposition . [1] Systolic BP  >= 160 OR Diastolic >= 100 AND [2] cardiac or neurologic symptoms (e.g., chest pain, difficulty breathing, unsteady gait, blurred vision)  Answer Assessment - Initial Assessment Questions 1. BLOOD PRESSURE: "What is the blood pressure?" "Did you take at least two measurements 5 minutes apart?"     169/96.   Almost fainted at work.   I was standing in line for lunch all of a sudden I felt dizzy and seeing double.  I had to be helped to sit down.   My heart is racing.   I'm seeing ok now.  This happened in high school.   I have high BP and vasovagal syncope. 2. ONSET: "When did you take your blood pressure?"     BP taken right after I almost fainted.   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   3. HOW: "How did you obtain the blood pressure?" (e.g., visiting nurse, automatic home BP monitor)     Cuff on my arm. 4.  HISTORY: "Do you have a history of high blood pressure?"     Yes       I'm still weak and like headed and the lights are bothering me.   It's been about 45 minutes since it happened. 5. MEDICATIONS: "Are you taking any medications for blood pressure?" "Have you missed any doses recently?"     I'm not on medication for BP now.   They took you off of it 4-5 years ago.   It's been fine without medications. 6. OTHER SYMPTOMS: "Do you have any symptoms?" (e.g., headache, chest pain, blurred vision, difficulty breathing, weakness)     I was a little short of breath.   I've had some dizzy spells over the last couple of months. 7. PREGNANCY: "Is there any chance you are pregnant?" "When was your last menstrual period?"     N/A  Protocols used: HIGH BLOOD PRESSURE-A-AH

## 2017-12-06 NOTE — ED Provider Notes (Signed)
[facility] Emergency Department Provider Note    First MD Initiated Contact with Patient 12/06/17 1521     (approximate)  I have reviewed the triage vital signs and the nursing notes.   HISTORY  Chief Complaint Near Syncope    HPI Tony Peck is a 27 y.o. male with a history of vasovagal syncope presents the ER with near syncopal episode today while he was at work.  Standing and lunch line he started to feel dizzy and felt like he was seeing double.  Felt that he was walking the walls.  He was helped to his office and help to sit down with improvement in his symptoms.  Denies any chest pain or shortness of breath but did feel palpitations that started after the symptoms started.  States he does have a history of anxiety and has been having frequent similar episodes in the past.  Denies any medication use.  Does have significant family history of anxiety.  Denies any numbness or tingling.    Past Medical History:  Diagnosis Date  . Hypertension 2009   has seen cardiology  . Marijuana use   . Seasonal allergies   . Umbilical hernia   . Vasovagal syncope 2012   saw cards   Family History  Problem Relation Age of Onset  . Hypertension Other        grandparents  . Hypertension Brother   . CAD Maternal Grandfather 54       MI  . Kidney disease Maternal Grandfather   . Diabetes Maternal Grandfather   . Cancer Maternal Grandfather        colon   Past Surgical History:  Procedure Laterality Date  . HERNIA REPAIR  11/26/13   umbilical henira  . MYRINGOTOMY Bilateral 1998  . NASAL SINUS SURGERY  2000  . TONSILLECTOMY AND ADENOIDECTOMY  1997   Patient Active Problem List   Diagnosis Date Noted  . Influenza 09/05/2014  . Hypertension 11/21/2013  . Health maintenance examination 11/21/2013  . Umbilical hernia 08/13/2013      Prior to Admission medications   Medication Sig Start Date End Date Taking? Authorizing Provider  cetirizine (ZYRTEC)  10 MG tablet Take 10 mg by mouth daily.    [provider]  Multiple Vitamins-Minerals (EMERGEN-C VITAMIN C PO) Take by mouth as directed.    [provider]    Allergies Adhesive [tape]    Social History Social History   Tobacco Use  . Smoking status: Never Smoker  . Smokeless tobacco: Never Used  Substance Use Topics  . Alcohol use: Yes    Comment: occasionally  . Drug use: Yes    Comment: MJ-occasional    Review of Systems Patient denies headaches, rhinorrhea, blurry vision, numbness, shortness of breath, chest pain, edema, cough, abdominal pain, nausea, vomiting, diarrhea, dysuria, fevers, rashes or hallucinations unless otherwise stated above in HPI. ____________________________________________   PHYSICAL EXAM:  VITAL SIGNS: Vitals:   12/06/17 1441  BP: (!) 194/86  Pulse: 92  Resp: 17  Temp: 97.9 F (36.6 C)  SpO2: 100%    Constitutional: Alert and oriented.  Eyes: Conjunctivae are normal.  Head: Atraumatic. Nose: No congestion/rhinnorhea. Mouth/Throat: Mucous membranes are moist.   Neck: No stridor. Painless ROM.  Cardiovascular: Normal rate, regular rhythm. Grossly normal heart sounds.  Good peripheral circulation. Respiratory: Normal respiratory effort.  No retractions. Lungs CTAB. Gastrointestinal: Soft and nontender. No distention. No abdominal bruits. No CVA tenderness. Genitourinary:  Musculoskeletal: No lower extremity  tenderness nor edema.  No joint effusions. Neurologic:  CN- intact.  No facial droop, Normal FNF.  Normal heel to shin.  Sensation intact bilaterally. Normal speech and language. No gross focal neurologic deficits are appreciated. No gait instability.  Skin:  Skin is warm, dry and intact. No rash noted. Psychiatric: Mood and affect are normal. Speech and behavior are normal.  ____________________________________________   LABS (all labs ordered are listed, but only abnormal results are displayed)  Results for  orders placed or performed during the hospital encounter of 12/06/17 (from the past 24 hour(s))  Basic metabolic panel     Status: Abnormal   Collection Time: 12/06/17  2:52 PM  Result Value Ref Range   Sodium 138 135 - 145 mmol/L   Potassium 3.3 (L) 3.5 - 5.1 mmol/L   Chloride 104 101 - 111 mmol/L   CO2 26 22 - 32 mmol/L   Glucose, Bld 132 (H) 65 - 99 mg/dL   BUN 13 6 - 20 mg/dL   Creatinine, Ser 0.980.81 0.61 - 1.24 mg/dL   Calcium 9.3 8.9 - 11.910.3 mg/dL   GFR calc non Af Amer >60 >60 mL/min   GFR calc Af Amer >60 >60 mL/min   Anion gap 8 5 - 15  CBC     Status: Abnormal   Collection Time: 12/06/17  2:52 PM  Result Value Ref Range   WBC 6.6 3.8 - 10.6 K/uL   RBC 4.47 4.40 - 5.90 MIL/uL   Hemoglobin 15.3 13.0 - 18.0 g/dL   HCT 14.743.5 82.940.0 - 56.252.0 %   MCV 97.3 80.0 - 100.0 fL   MCH 34.2 (H) 26.0 - 34.0 pg   MCHC 35.1 32.0 - 36.0 g/dL   RDW 13.011.8 86.511.5 - 78.414.5 %   Platelets 231 150 - 440 K/uL  Glucose, capillary     Status: Abnormal   Collection Time: 12/06/17  2:52 PM  Result Value Ref Range   Glucose-Capillary 143 (H) 65 - 99 mg/dL   ____________________________________________  EKG My review and personal interpretation at Time: 14:43   Indication: htn  Rate: 75  Rhythm: sinus Axis: normal Other: short pr, no stemi, no brugada ____________________________________________  RADIOLOGY   ____________________________________________   PROCEDURES  Procedure(s) performed:  Procedures    Critical Care performed: no ____________________________________________   INITIAL IMPRESSION / ASSESSMENT AND PLAN / ED COURSE  Pertinent labs & imaging results that were available during my care of the patient were reviewed by me and considered in my medical decision making (see chart for details).   DDX: Dehydration, orthostasis, vasovagal, dysrhythmia, electrolyte abnormality, anemia, seizure, vertigo, anxiety  Tony Peck is a 27 y.o. who presents to the ED with symptoms as  described above.  Patient's neuro exam is nonfocal and reassuring.  He is able to ambulate with steady gait with no symptomatic dizziness.  Is hypertensive upon arrival to the ER but has no signs or symptoms of endorgan damage at this time.  EKG does show shortened PR interval, possibly concerning for the WPW but patient's symptoms started prior to feeling palpitations.  His abdominal exam is soft and benign.  Is tolerating oral hydration.  At this point I do believe patient stable and appropriate for outpatient referral to cardiology for further evaluation of his symptomatology.  Discussed signs and symptoms for which the patient should return immediately to the hospital.      As part of my medical decision making, I reviewed the following data within the electronic medical  record:  Nursing notes reviewed and incorporated, Labs reviewed, notes from prior ED visits.   ____________________________________________   FINAL CLINICAL IMPRESSION(S) / ED DIAGNOSES  Final diagnoses:  Near syncope  Palpitations      NEW MEDICATIONS STARTED DURING THIS VISIT:  New Prescriptions   No medications on file     Note:  This document was prepared using Dragon voice recognition software and may include unintentional dictation errors.    Willy Eddy, MD 12/06/17 [phone]

## 2017-12-12 ENCOUNTER — Encounter: Payer: Self-pay | Admitting: Family Medicine

## 2017-12-12 ENCOUNTER — Ambulatory Visit (INDEPENDENT_AMBULATORY_CARE_PROVIDER_SITE_OTHER): Payer: 59 | Admitting: Family Medicine

## 2017-12-12 VITALS — BP 146/82 | HR 75 | Temp 97.9°F | Ht 74.0 in | Wt 203.0 lb

## 2017-12-12 DIAGNOSIS — I10 Essential (primary) hypertension: Secondary | ICD-10-CM | POA: Diagnosis not present

## 2017-12-12 DIAGNOSIS — R002 Palpitations: Secondary | ICD-10-CM | POA: Insufficient documentation

## 2017-12-12 DIAGNOSIS — R9431 Abnormal electrocardiogram [ECG] [EKG]: Secondary | ICD-10-CM

## 2017-12-12 LAB — TSH: TSH: 2.46 u[IU]/mL (ref 0.35–4.50)

## 2017-12-12 MED ORDER — LORAZEPAM 0.5 MG PO TABS: 0.5000 mg | ORAL_TABLET | Freq: Two times a day (BID) | ORAL | 0 refills | Status: DC | PRN

## 2017-12-12 NOTE — Progress Notes (Signed)
BP (!) 146/82 (BP Location: Right Arm, Cuff Size: Normal)   Pulse 75   Temp 97.9 F (36.6 C) (Oral)   Ht 6\' 2"  (1.88 m)   Wt 203 lb (92.1 kg)   SpO2 99%   BMI 26.06 kg/m   BP Readings from Last 3 Encounters:  12/12/17 (!) 146/82  12/06/17 136/74  09/05/14 (!) 150/90    CC: ER f/u visit Subjective:    Patient ID: Tony Peck, male    DOB: 11/16/1990, 27 y.o.   MRN: [id number]  HPI: Tony Peck is a 27 y.o. male presenting on 12/12/2017 for Hospitalization Follow-up ([facility] ED on 12/06/17, primary dx near syncope. Was given 4 tabs of Ativan in ED. Wants to discuss med. Has appt with cardio tomorrow.)   Last seen 08/2014.  Recent ER visit (note reviewed) for near syncope while at work. [employer], started feeling dizzy while at break room for lunch. Dizziness described as room spinning and lagging vision with head movements, double vision, dimming of lights. Felt palpitations. Some dyspnea with this. No chest pain. BP was 169/94 at office. Labwork at ER revealed K 3.3 otherwise normal (CBC, BMP). BP at ER 194 systolic. EKG showed shortened PR interval - has f/u with cards scheduled for tomorrow (Dr End).   Wasn't feeling anxious when this episode happened.  He did have breakfast and lunch that day.  H/o HTN in high school - took benazepril for several years. Freshman year of college he self stopped medication. He does have fmhx HTN.  BP at home has been running elevated. Some dull headache associated with this.   Finds lorazepam has helped.   Relevant past medical, surgical, family and social history reviewed and updated as indicated. Interim medical history since our last visit reviewed. Allergies and medications reviewed and updated. Outpatient Medications Prior to Visit  Medication Sig Dispense Refill  . cetirizine (ZYRTEC) 10 MG tablet Take 10 mg by mouth daily.    . Multiple Vitamins-Minerals (EMERGEN-C VITAMIN C  PO) Take by mouth as directed.    Marland Kitchen. LORazepam (ATIVAN) 0.5 MG tablet Take 1 tablet (0.5 mg total) by mouth every 8 (eight) hours as needed for anxiety. 4 tablet 0   No facility-administered medications prior to visit.      Per HPI unless specifically indicated in ROS section below Review of Systems     Objective:    BP (!) 146/82 (BP Location: Right Arm, Cuff Size: Normal)   Pulse 75   Temp 97.9 F (36.6 C) (Oral)   Ht 6\' 2"  (1.88 m)   Wt 203 lb (92.1 kg)   SpO2 99%   BMI 26.06 kg/m   Wt Readings from Last 3 Encounters:  12/12/17 203 lb (92.1 kg)  12/06/17 190 lb (86.2 kg)  09/05/14 189 lb 8 oz (86 kg)    Physical Exam  Constitutional: He appears well-developed and well-nourished. No distress.  HENT:  Mouth/Throat: Oropharynx is clear and moist. No oropharyngeal exudate.  Eyes: Pupils are equal, round, and reactive to light. Conjunctivae and EOM are normal.  Neck: No thyromegaly present.  Cardiovascular: Normal rate, regular rhythm and normal heart sounds.  No murmur heard. Pulmonary/Chest: Effort normal and breath sounds normal. No respiratory distress. He has no wheezes. He has no rales.  Musculoskeletal: He exhibits no edema.  Lymphadenopathy:    He has no cervical adenopathy.  Skin: Skin is warm. Capillary refill takes less  than 2 seconds. No rash noted.  Psychiatric: He has a normal mood and affect.  Nursing note and vitals reviewed.  Results for orders placed or performed during the hospital encounter of 12/06/17  Basic metabolic panel  Result Value Ref Range   Sodium 138 135 - 145 mmol/L   Potassium 3.3 (L) 3.5 - 5.1 mmol/L   Chloride 104 101 - 111 mmol/L   CO2 26 22 - 32 mmol/L   Glucose, Bld 132 (H) 65 - 99 mg/dL   BUN 13 6 - 20 mg/dL   Creatinine, Ser 4.09 0.61 - 1.24 mg/dL   Calcium 9.3 8.9 - 81.1 mg/dL   GFR calc non Af Amer >60 >60 mL/min   GFR calc Af Amer >60 >60 mL/min   Anion gap 8 5 - 15  CBC  Result Value Ref Range   WBC 6.6 3.8 - 10.6 K/uL    RBC 4.47 4.40 - 5.90 MIL/uL   Hemoglobin 15.3 13.0 - 18.0 g/dL   HCT 91.4 78.2 - 95.6 %   MCV 97.3 80.0 - 100.0 fL   MCH 34.2 (H) 26.0 - 34.0 pg   MCHC 35.1 32.0 - 36.0 g/dL   RDW 21.3 08.6 - 57.8 %   Platelets 231 150 - 440 K/uL  Glucose, capillary  Result Value Ref Range   Glucose-Capillary 143 (H) 65 - 99 mg/dL   Lab Results  Component Value Date   TSH 2.99 11/22/2013       Assessment & Plan:   Problem List Items Addressed This Visit    Shortened PR interval    borderline      Palpitations - Primary    Describes intermittent tachypalpitations most recent flare associated with presyncope and double vision. EKG at ER showed shortened PR. 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 Check TSH today.  Will refill lorazepam short course - discussed hesitation to treat symptoms with benzo if truly coming from heart. Will await cards eval tomorrow.       Relevant Orders   TSH   Hypertension    Longstanding h/o this, fmhx HTN as well. Treated with benazepril during high school years. Self stopped antihypertensive in college, working on healthy diet and exercise however BP remained borderline elevated. Reviewed goal BP <130/80 and ideally lower. Will likely need to restart antihypertensive therapy, likely beta blocker - will await cards eval tomorrow.           Meds ordered this encounter  Medications  . LORazepam (ATIVAN) 0.5 MG tablet    Sig: Take 1 tablet (0.5 mg total) by mouth 2 (two) times daily as needed for anxiety.    Dispense:  10 tablet    Refill:  0   Orders Placed This Encounter  Procedures  . TSH    Follow up plan: Return if symptoms worsen or fail to improve.  Eustaquio Boyden, MD

## 2017-12-12 NOTE — Assessment & Plan Note (Addendum)
Describes intermittent tachypalpitations most recent flare associated with presyncope and double vision. EKG at ER showed shortened PR. 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 Check TSH today.  Will refill lorazepam short course - discussed hesitation to treat symptoms with benzo if truly coming from heart. Will await cards eval tomorrow.

## 2017-12-12 NOTE — Assessment & Plan Note (Signed)
Longstanding h/o this, fmhx HTN as well. Treated with benazepril during high school years. Self stopped antihypertensive in college, working on healthy diet and exercise however BP remained borderline elevated. Reviewed goal BP <130/80 and ideally lower. Will likely need to restart antihypertensive therapy, likely beta blocker - will await cards eval tomorrow.

## 2017-12-12 NOTE — Patient Instructions (Addendum)
I agree with cardiology evaluation tomorrow. Will await heart evaluation prior to starting blood pressure medicine. May continue lorazepam as needed for now, but want to rule out cardiac cause of symptoms.  Labs today.

## 2017-12-12 NOTE — Assessment & Plan Note (Signed)
borderline 

## 2017-12-13 ENCOUNTER — Encounter: Payer: Self-pay | Admitting: Internal Medicine

## 2017-12-13 ENCOUNTER — Ambulatory Visit (INDEPENDENT_AMBULATORY_CARE_PROVIDER_SITE_OTHER): Payer: 59 | Admitting: Internal Medicine

## 2017-12-13 ENCOUNTER — Telehealth: Payer: Self-pay | Admitting: Internal Medicine

## 2017-12-13 ENCOUNTER — Other Ambulatory Visit
Admission: RE | Admit: 2017-12-13 | Discharge: 2017-12-13 | Disposition: A | Discharge status: Home / Self Care | Payer: 59 | Source: Ambulatory Visit | Attending: Internal Medicine | Admitting: Internal Medicine

## 2017-12-13 VITALS — BP 158/72 | HR 64 | Ht 75.0 in | Wt 202.5 lb

## 2017-12-13 DIAGNOSIS — R42 Dizziness and giddiness: Secondary | ICD-10-CM

## 2017-12-13 DIAGNOSIS — R0602 Shortness of breath: Secondary | ICD-10-CM | POA: Diagnosis not present

## 2017-12-13 DIAGNOSIS — I10 Essential (primary) hypertension: Secondary | ICD-10-CM | POA: Insufficient documentation

## 2017-12-13 DIAGNOSIS — R002 Palpitations: Secondary | ICD-10-CM | POA: Insufficient documentation

## 2017-12-13 DIAGNOSIS — R55 Syncope and collapse: Secondary | ICD-10-CM | POA: Diagnosis not present

## 2017-12-13 NOTE — Telephone Encounter (Signed)
24 hour for catecholamines re-entered. S/w lab tech and she said that was all that was needed.

## 2017-12-13 NOTE — Progress Notes (Signed)
New Outpatient Visit Date: 12/13/2017  Primary Care Provider: Eustaquio Boyden, MD [address] [place], [place] [address]  Chief Complaint: Dizziness and elevated blood pressure  HPI:  Tony Peck is a 27 y.o. male who is being seen today for the evaluation of dizziness and elevated blood pressures followed ED visit earlier this month. He has a history of hypertension dating back to his teenage years as well as vasovagal syncope (followed at [facility] as an adolescent with prior echo and tilt table testing).  Tony Peck reports intermittent dizziness and syncope dating back several years ago.  He last passed out last summer after hiking in the heat.  While eating, he became lightheaded and diaphoretic, subsequently losing consciousness for a few seconds.  On 12/06/17, Tony Peck was standing in line, getting his lunch, when he became progressively more dizzy.  He felt as though his vision was lagging and things were moving around him.  He was ultimately able to stumble back to his office with the assistance of a coworker.  He checked his blood pressure and found it to be 162/94.  He also experienced a dull headache at the time, as well as double vision.  He has experienced similar episodes for years, though typically not as severe.  The dizziness often lasts several hours and then is followed by a headache that lasts most of the day.  Episodes are sometimes accompanied by a racing heart and skipped beats.  He notes "random" shortness of breath that sometimes presents with the dizziness (though not always).  Tony Peck exercises regularly and also walks his dogs.  He has felt a bit dizzy/off-balance when walking his dogs, though he has never passed out with exertion.  He denies chest pain.  In the ED on 6/19, he was noted to be quite hypertensive.  Tony Peck was started on metoprolol several years ago but did not tolerate this due to fatigue.  He switched to benazepril with reasonable blood pressure  control.  He "weaned" himself off this in college, as he made changes in his diet to help lower his blood pressure.  He has been off medications for several years.  He notes that his blood pressure had been normal up until a few months ago, when it began climbing again.  --------------------------------------------------------------------------------------------------  Cardiovascular History & Procedures: Cardiovascular Problems:  Dizziness and syncope (previously diagnosed with vasovagal syncope)  Hypertension  Risk Factors:  Hypertension and male gender  Cath/PCI:  None  CV Surgery:  None  EP Procedures and Devices:  Tilt table (08/06/07, [facility]): Normal study except for decreased baroreflex gain.  History and study findings suggestive with vasovagal syncope.  Non-Invasive Evaluation(s):  TTE (05/06/11, [facility]): No cardiac disease identified.  Normal LVEF.  No evidence of coarctation of the aorta or LVH.  Recent CV Pertinent Labs: Lab Results  Component Value Date   CHOL 131 11/22/2013   HDL 46.20 11/22/2013   LDLCALC 75 11/22/2013   TRIG 48.0 11/22/2013   CHOLHDL 3 11/22/2013   K 3.3 (L) 12/06/2017   BUN 13 12/06/2017   CREATININE 0.81 12/06/2017    --------------------------------------------------------------------------------------------------  Past Medical History:  Diagnosis Date  . Hypertension 2009   has seen cardiology  . Marijuana use   . Seasonal allergies   . Umbilical hernia   . Vasovagal syncope 2012   saw cards    Past Surgical History:  Procedure Laterality Date  . HERNIA REPAIR  11/26/13   umbilical henira  . MYRINGOTOMY  Bilateral 1998  . NASAL SINUS SURGERY  2000  . TONSILLECTOMY AND ADENOIDECTOMY  1997    Current Meds  Medication Sig  . cetirizine (ZYRTEC) 10 MG tablet Take 10 mg by mouth daily.  . Multiple Vitamins-Minerals (EMERGEN-C VITAMIN C PO) Take by mouth as directed.    Allergies: Adhesive [tape]  Social History    Tobacco Use  . Smoking status: Never Smoker  . Smokeless tobacco: Never Used  Substance Use Topics  . Alcohol use: Yes    Comment: occasionally  . Drug use: Yes    Comment: MJ-occasional    Family History  Problem Relation Age of Onset  . Hypertension Other        grandparents  . Hypertension Brother   . CAD Maternal Grandfather [id number]       MI  . Kidney disease Maternal Grandfather   . Diabetes Maternal Grandfather   . Cancer Maternal Grandfather        colon    Review of Systems: A 12-system review of systems was performed and was negative except as noted in the HPI.  --------------------------------------------------------------------------------------------------  Physical Exam: BP (!) 158/72 (BP Location: Right Arm, Patient Position: Sitting, Cuff Size: Normal)   Pulse 64   Ht 6\' 3"  (1.905 m)   Wt 202 lb 8 oz (91.9 kg)   BMI 25.31 kg/m   General:  NAD.  Accompanied by his mother. HEENT: No conjunctival pallor or scleral icterus. Moist mucous membranes. OP clear. Neck: Supple without lymphadenopathy, thyromegaly, JVD, or HJR. No carotid bruit. Lungs: Normal work of breathing. Clear to auscultation bilaterally without wheezes or crackles. Heart: Regular rate and rhythm without murmurs, rubs, or gallops. Non-displaced PMI. Abd: Bowel sounds present. Soft, NT/ND without hepatosplenomegaly Ext: No lower extremity edema. Radial, PT, and DP pulses are 2+ bilaterally Skin: Warm and dry without rash. Neuro: CNIII-XII intact. Strength and fine-touch sensation intact in upper and lower extremities bilaterally. Psych: Normal mood and affect.  EKG:  NSR without abnormalities.  Lab Results  Component Value Date   WBC 6.6 12/06/2017   HGB 15.3 12/06/2017   HCT 43.5 12/06/2017   MCV 97.3 12/06/2017   PLT 231 12/06/2017    Lab Results  Component Value Date   NA 138 12/06/2017   K 3.3 (L) 12/06/2017   CL 104 12/06/2017   CO2 26 12/06/2017   BUN 13 12/06/2017    CREATININE 0.81 12/06/2017   GLUCOSE 132 (H) 12/06/2017    Lab Results  Component Value Date   CHOL 131 11/22/2013   HDL 46.20 11/22/2013   LDLCALC 75 11/22/2013   TRIG 48.0 11/22/2013   CHOLHDL 3 11/22/2013     --------------------------------------------------------------------------------------------------  ASSESSMENT AND PLAN: Dizziness/syncope and palpitations Tony Peck describes at least 2 different events.  He frequently notes vision changes and feeling off balance, the he has also noted lightheadedness and brief syncope.  The latter is most consistent with vasovagal syncope.  His other dizziness is non-specific and may well be non-cardiac.  Exam today is unrevealing.  [facility] was also benign (at least 3 echoes and tilt table testing).  Given recurrent symptoms, we have agreed to obtain an echo and 48 hour Holter monitor.  If this workup is unrevealing, exercise tolerance test to exclude ischemia (unlikely given age) and exercise-induced arrhythmias will be considered.  Evaluation for underlying neurologic cause should also be considered, particularly if aforementioned workup is negative.  Hypertension Blood pressure modestly elevated today without significant orthostatic changes.  Given young age and history of hypertension dating back to his teen years, we will proceed with secondary hypertension workup, including renal artery Doppler, serum aldosterone/plasma renin activity, and 24 hour urine catecholamines/metanephrines.  I have encouraged sodium restriction.  We will defer medical therapy pending aforementioned workup.  Shortness of breath This happens randomly and is intermittently associated with palpitations.  We will proceed with echo and Holter monitor, as above.  Follow-up: Return to clinic in 6 weeks.  Yvonne Kendall, MD 12/13/2017 2:13 PM

## 2017-12-13 NOTE — Telephone Encounter (Signed)
[facility] lab calling States that she accidentally deleted the catecholamines order so she has had to mark it as discontinued She is requesting we place a new order in  Please also check the 24 hour urine order

## 2017-12-13 NOTE — Patient Instructions (Signed)
Medication Instructions:  Your physician recommends that you continue on your current medications as directed. Please refer to the Current Medication list given to you today.   Labwork: Your physician recommends that you return for lab work in: TODAY. - 24 hour urine collection. - Lab work for serum aldosterone and renin activity. - Please go to the [facility]. You will check in at the front desk to the right as you walk into the atrium. Valet Parking is offered if needed.    Testing/Procedures: Your physician has requested that you have an echocardiogram. Echocardiography is a painless test that uses sound waves to create images of your heart. It provides your doctor with information about the size and shape of your heart and how well your heart's chambers and valves are working. This procedure takes approximately one hour. There are no restrictions for this procedure. -You may get an IV, if needed, to receive an ultrasound enhancing agent through to better visualize your heart.    Your physician has requested that you have a renal artery duplex. During this test, an ultrasound is used to evaluate blood flow to the kidneys. Allow one hour for this exam. Do not eat after midnight the day before and avoid carbonated beverages. Take your medications as you usually do.   Your physician has recommended that you wear a 48 HOUR holter monitor. Holter monitors are medical devices that record the heart's electrical activity. Doctors most often use these monitors to diagnose arrhythmias. Arrhythmias are problems with the speed or rhythm of the heartbeat. The monitor is a small, portable device. You can wear one while you do your normal daily activities. This is usually used to diagnose what is causing palpitations/syncope (passing out).    Follow-Up: Your physician recommends that you schedule a follow-up appointment in: 6 WEEKS WITH DR END OR APP.    24-Hour Urine Collection How do I do a  24-hour urine collection?  When you get up in the morning, urinate in the toilet and flush. Write down the time. This will be your start time on the day of collection and your end time on the next morning.  From then on, collect all of your urine in the plastic jug that is given to you.  Stop collecting your urine 24 hours after you started.  If the plastic jug that is given to you already has liquid in it, that is okay. Do not throw out the liquid or rinse out the jug. Some tests need the liquid to be added to your urine.  Keep your plastic jug cool in an ice chest or keep it in the refrigerator during the test.  When 24 hours are over, bring your plastic jug to the clinic lab. Keep the jug cool in an ice chest while you are bringing it to the lab. This information is not intended to replace advice given to you by your health care provider. Make sure you discuss any questions you have with your health care provider. Document Released: 09/02/2008 Document Revised: 02/08/2016 Document Reviewed: 10/30/2013 Elsevier Interactive Patient Education  2018 [employer].    Echocardiogram An echocardiogram, or echocardiography, uses sound waves (ultrasound) to produce an image of your heart. The echocardiogram is simple, painless, obtained within a short period of time, and offers valuable information to your health care provider. The images from an echocardiogram can provide information such as:  Evidence of coronary artery disease (CAD).  Heart size.  Heart muscle function.  Heart valve  function.  Aneurysm detection.  Evidence of a past heart attack.  Fluid buildup around the heart.  Heart muscle thickening.  Assess heart valve function.  Tell a health care provider about:  Any allergies you have.  All medicines you are taking, including vitamins, herbs, eye drops, creams, and over-the-counter medicines.  Any problems you or family members have had with anesthetic  medicines.  Any blood disorders you have.  Any surgeries you have had.  Any medical conditions you have.  Whether you are pregnant or may be pregnant. What happens before the procedure? No special preparation is needed. Eat and drink normally. What happens during the procedure?  In order to produce an image of your heart, gel will be applied to your chest and a wand-like tool (transducer) will be moved over your chest. The gel will help transmit the sound waves from the transducer. The sound waves will harmlessly bounce off your heart to allow the heart images to be captured in real-time motion. These images will then be recorded.  You may need an IV to receive a medicine that improves the quality of the pictures. What happens after the procedure? You may return to your normal schedule including diet, activities, and medicines, unless your health care provider tells you otherwise. This information is not intended to replace advice given to you by your health care provider. Make sure you discuss any questions you have with your health care provider. Document Released: 06/03/2000 Document Revised: 01/23/2016 Document Reviewed: 02/11/2013 Elsevier Interactive Patient Education  2017 [employer]

## 2017-12-14 ENCOUNTER — Other Ambulatory Visit: Payer: Self-pay | Admitting: Internal Medicine

## 2017-12-14 ENCOUNTER — Encounter: Payer: Self-pay | Admitting: Internal Medicine

## 2017-12-14 DIAGNOSIS — R0600 Dyspnea, unspecified: Secondary | ICD-10-CM

## 2017-12-14 DIAGNOSIS — R42 Dizziness and giddiness: Secondary | ICD-10-CM | POA: Insufficient documentation

## 2017-12-14 DIAGNOSIS — I10 Essential (primary) hypertension: Secondary | ICD-10-CM

## 2017-12-14 DIAGNOSIS — R0602 Shortness of breath: Secondary | ICD-10-CM | POA: Insufficient documentation

## 2017-12-14 DIAGNOSIS — R072 Precordial pain: Secondary | ICD-10-CM

## 2017-12-14 DIAGNOSIS — R55 Syncope and collapse: Secondary | ICD-10-CM | POA: Insufficient documentation

## 2017-12-17 LAB — ALDOSTERONE + RENIN ACTIVITY W/ RATIO
ALDO / PRA RATIO: 3.5 (ref 0.0–30.0)
Aldosterone: 4.4 ng/dL (ref 0.0–30.0)
PRA LC/MS/MS: 1.262 ng/mL/h (ref 0.167–5.380)

## 2017-12-18 ENCOUNTER — Ambulatory Visit (INDEPENDENT_AMBULATORY_CARE_PROVIDER_SITE_OTHER): Payer: 59

## 2017-12-18 DIAGNOSIS — R002 Palpitations: Secondary | ICD-10-CM | POA: Diagnosis not present

## 2017-12-18 DIAGNOSIS — I10 Essential (primary) hypertension: Secondary | ICD-10-CM | POA: Diagnosis not present

## 2017-12-18 DIAGNOSIS — R42 Dizziness and giddiness: Secondary | ICD-10-CM

## 2017-12-20 ENCOUNTER — Ambulatory Visit (INDEPENDENT_AMBULATORY_CARE_PROVIDER_SITE_OTHER): Payer: 59

## 2017-12-20 ENCOUNTER — Other Ambulatory Visit: Payer: Self-pay

## 2017-12-20 ENCOUNTER — Ambulatory Visit: Payer: 59

## 2017-12-20 DIAGNOSIS — R0600 Dyspnea, unspecified: Secondary | ICD-10-CM

## 2017-12-20 DIAGNOSIS — I10 Essential (primary) hypertension: Secondary | ICD-10-CM

## 2017-12-20 DIAGNOSIS — R55 Syncope and collapse: Secondary | ICD-10-CM

## 2017-12-20 DIAGNOSIS — R072 Precordial pain: Secondary | ICD-10-CM

## 2017-12-20 DIAGNOSIS — R42 Dizziness and giddiness: Secondary | ICD-10-CM

## 2017-12-25 ENCOUNTER — Ambulatory Visit
Admission: RE | Admit: 2017-12-25 | Discharge: 2017-12-25 | Disposition: A | Discharge status: Home / Self Care | Payer: 59 | Source: Ambulatory Visit | Attending: Internal Medicine | Admitting: Internal Medicine

## 2017-12-25 DIAGNOSIS — R42 Dizziness and giddiness: Secondary | ICD-10-CM | POA: Diagnosis present

## 2017-12-25 DIAGNOSIS — I10 Essential (primary) hypertension: Secondary | ICD-10-CM | POA: Diagnosis present

## 2017-12-25 DIAGNOSIS — R002 Palpitations: Secondary | ICD-10-CM | POA: Insufficient documentation

## 2018-01-25 ENCOUNTER — Ambulatory Visit: Payer: 59 | Admitting: Internal Medicine

## 2018-04-13 ENCOUNTER — Ambulatory Visit: Payer: 59 | Admitting: Internal Medicine

## 2018-04-13 ENCOUNTER — Encounter

## 2018-08-29 ENCOUNTER — Ambulatory Visit (INDEPENDENT_AMBULATORY_CARE_PROVIDER_SITE_OTHER): Payer: 59 | Admitting: Family Medicine

## 2018-08-29 ENCOUNTER — Telehealth: Payer: Self-pay

## 2018-08-29 VITALS — BP 160/100 | HR 91 | Temp 98.4°F

## 2018-08-29 DIAGNOSIS — I10 Essential (primary) hypertension: Secondary | ICD-10-CM | POA: Diagnosis not present

## 2018-08-29 DIAGNOSIS — R002 Palpitations: Secondary | ICD-10-CM

## 2018-08-29 DIAGNOSIS — R05 Cough: Secondary | ICD-10-CM | POA: Diagnosis not present

## 2018-08-29 DIAGNOSIS — R059 Cough, unspecified: Secondary | ICD-10-CM

## 2018-08-29 DIAGNOSIS — R509 Fever, unspecified: Secondary | ICD-10-CM | POA: Diagnosis not present

## 2018-08-29 DIAGNOSIS — R0602 Shortness of breath: Secondary | ICD-10-CM

## 2018-08-29 NOTE — Telephone Encounter (Signed)
Pt said since 08/22/18 pt has had fever on and off; highest reading was 101; usually between 99.8 - 100. 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 Today pt is having SOB upon exertion. pts boss called [facility] and was advised pt needed to be tested. Since then pt has been isolated in his office at work. Pt also traveled by plane to [facility] from 08/16/18 - 08/19/18. To pts knowledge he has not come in contact with confirmed flu or corona virus individual. Pt is coming to [facility] following protocol for pt to call office when arrives at [facility] and will be escorted in back door, CMA will escort pt to treatment room and Dr Reece Agar will see pt and we will take care of him. Pt voiced understanding and is appreciative and will call office upon arrival of parking in back parking lot.Pt should arrive around 4:10 - 4:15 pm.FYI to Dr Reece Agar.

## 2018-08-29 NOTE — Patient Instructions (Addendum)
Flu swab today negative Respiratory viral panel today pending. We will call you when this results.  Stay home for now - until we call you with results. Return in 1 month for blood pressure follow up. Let us know if fever >101, or worsening cough.

## 2018-08-29 NOTE — Progress Notes (Signed)
BP (!) 160/100 (BP Location: Left Arm, Patient Position: Sitting, Cuff Size: Large)   Pulse 91   Temp 98.4 F (36.9 C)   SpO2 99%    CC: fever, cough Subjective:    Patient ID: Tony Peck, male    DOB: Jul 22, 1990, 28 y.o.   MRN: [id number]  HPI: Tony Peck is a 28 y.o. male presenting on 08/29/2018 for URI (Traveled from [place] 08-20-18. Not feeling well a few days later. Ran a fever from 3-4 to 3-8. Cough started soon after the fever. Started feeling a little better and went to work yesterday.)   See recent phone note for details.  Recent trip to [place], [place] 2/27-08/19/2018.  No known coronavirus exposure.   Started feeling ill 08/22/2018 - sent home early from work due to fatigue, body aches, productive cough with green sputum, chest congestion, fever Tmax 101. Last temperature 99.5 without medication on Sunday  08/26/2018 and started feeling better at that time. Currently improved but still noticing fatigue, dyspnea when walking dog and persistent productive cough. Ongoing PNdrainage.   While ill, he remained at home.   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   Denies ear pain, tooth pain, HA, ST, wheezing, abd pain or bowel changes.  Did have mono in the past.  No h/o asthma. No smokers at home.  Partner had the flu 06/2018. Partner just moved to [place].   Currently lives alone at home - next to [place] [facility].  Partner just moved to [place].     Relevant past medical, surgical, family and social history reviewed and updated as indicated. Interim medical history since our last visit reviewed. Allergies and medications reviewed and updated. Outpatient Medications Prior to Visit  Medication Sig Dispense Refill  . cetirizine (ZYRTEC) 10 MG tablet Take 10 mg by mouth daily.    . Multiple Vitamins-Minerals (EMERGEN-C VITAMIN C PO) Take by mouth as directed.      No facility-administered medications prior to visit.      Per HPI unless specifically indicated in ROS section below Review of Systems Objective:    BP (!) 160/100 (BP Location: Left Arm, Patient Position: Sitting, Cuff Size: Large)   Pulse 91   Temp 98.4 F (36.9 C)   SpO2 99%   Wt Readings from Last 3 Encounters:  12/13/17 202 lb 8 oz (91.9 kg)  12/12/17 203 lb (92.1 kg)  12/06/17 190 lb (86.2 kg)    Physical Exam Vitals signs and nursing note reviewed.  Constitutional:      General: He is not in acute distress.    Appearance: He is well-developed.  HENT:     Head: Normocephalic and atraumatic.     Right Ear: Hearing, tympanic membrane, ear canal and external ear normal.     Left Ear: Hearing, tympanic membrane, ear canal and external ear normal.     Nose: Mucosal edema and congestion present. No rhinorrhea.     Right Sinus: No maxillary sinus tenderness or frontal sinus tenderness.     Left Sinus: No maxillary sinus tenderness or frontal sinus tenderness.     Mouth/Throat:     Mouth: Mucous membranes are moist.     Pharynx: Uvula midline. No oropharyngeal exudate or posterior oropharyngeal erythema.     Tonsils: No tonsillar abscesses.  Eyes:     General: No scleral icterus.    Conjunctiva/sclera: Conjunctivae normal.  Pupils: Pupils are equal, round, and reactive to light.  Neck:     Musculoskeletal: Normal range of motion and neck supple.  Cardiovascular:     Rate and Rhythm: Normal rate and regular rhythm.     Pulses: Normal pulses.     Heart sounds: Normal heart sounds. No murmur.  Pulmonary:     Effort: Pulmonary effort is normal. No respiratory distress.     Breath sounds: No wheezing or rales.  Abdominal:     General: Abdomen is flat. Bowel sounds are normal. There is no distension.     Palpations: Abdomen is soft. There is no mass.     Tenderness: There is no abdominal tenderness. There is no guarding or rebound.     Hernia: No hernia is present.   Lymphadenopathy:     Cervical: No cervical adenopathy.  Skin:    General: Skin is warm and dry.     Findings: No rash.  Psychiatric:        Mood and Affect: Mood normal.        Behavior: Behavior normal.     Comments: Somewhat anxious       Results for orders placed or performed in visit on 08/29/18  Respiratory virus panel  Result Value Ref Range   Influenza A RNA Not Detected Not Detected   Influenza B RNA Not Detected Not Detected   RSV RNA Not Detected Not Detected   hMPV Not Detected Not Detected  POC Influenza A&B(BINAX/QUICKVUE)  Result Value Ref Range   Influenza A, POC Negative Negative   Influenza B, POC Negative Negative   Assessment & Plan:   Problem List Items Addressed This Visit    Shortness of breath   Palpitations    Never followed up with cards. Encouraged f/u.      Hypertension    Elevated bp today in setting of anxiety over current illness but also h/o HTN. Advised monitor at home and RTC 1 mo HTN f/u.       Cough - Primary    Overall improving adequately after initial illness late last week, may have been flu-like illness.  Flu swab negative. Will send respiratory panel STAT - negative. Will send coronavirus testing. No indications of pneumonia at this time but low threshold to image - reviewed symptoms to call [place] right away.  Recent travel to [place], lives in [place] (recent case there). Not consistent timeline for coronavirus risk however with how quickly epidemiology of this virus is changing, reasonable to suspect and test for this.  Self quarantine until test results. CDC forms filled out as well as recent contacts, forms will be faxed to [facility].       Relevant Orders   Respiratory virus panel (Completed)   POC Influenza A&B(BINAX/QUICKVUE) (Completed)   Novel Coronavirus 2019    Other Visit Diagnoses    Fever, unspecified fever cause       Relevant Orders   POC Influenza A&B(BINAX/QUICKVUE) (Completed)   Novel Coronavirus 2019       No  orders of the defined types were placed in this encounter.  Orders Placed This Encounter  Procedures  . Respiratory virus panel  . Novel Coronavirus 2019  . POC Influenza A&B(BINAX/QUICKVUE)    Follow up plan: Return in about 4 weeks (around 09/26/2018) for follow up visit.  Eustaquio Boyden, MD

## 2018-08-29 NOTE — Telephone Encounter (Signed)
Seen today. 

## 2018-08-30 ENCOUNTER — Encounter: Payer: Self-pay | Admitting: Family Medicine

## 2018-08-30 DIAGNOSIS — R059 Cough, unspecified: Secondary | ICD-10-CM | POA: Insufficient documentation

## 2018-08-30 DIAGNOSIS — R509 Fever, unspecified: Secondary | ICD-10-CM | POA: Diagnosis not present

## 2018-08-30 DIAGNOSIS — R05 Cough: Secondary | ICD-10-CM | POA: Insufficient documentation

## 2018-08-30 LAB — POC INFLUENZA A&B (BINAX/QUICKVUE)
INFLUENZA A, POC: NEGATIVE
Influenza B, POC: NEGATIVE

## 2018-08-30 LAB — RESPIRATORY VIRUS PANEL
INFLUENZA A RNA: NOT DETECTED
Influenza B RNA: NOT DETECTED
RSV RNA: NOT DETECTED
hMPV: NOT DETECTED

## 2018-08-30 NOTE — Assessment & Plan Note (Addendum)
Elevated bp today in setting of anxiety over current illness but also h/o HTN. Advised monitor at home and RTC 1 mo HTN f/u.

## 2018-08-30 NOTE — Assessment & Plan Note (Signed)
Never followed up with cards. Encouraged f/u.

## 2018-08-30 NOTE — Assessment & Plan Note (Addendum)
Overall improving adequately after initial illness late last week, may have been flu-like illness.  Flu swab negative. Will send respiratory panel STAT - negative. Will send coronavirus testing. No indications of pneumonia at this time but low threshold to image - reviewed symptoms to call [place] right away.  Recent travel to [place], lives in [place] (recent case there). Not consistent timeline for coronavirus risk however with how quickly epidemiology of this virus is changing, reasonable to suspect and test for this.  Self quarantine until test results. CDC forms filled out as well as recent contacts, forms will be faxed to [facility].

## 2018-09-03 ENCOUNTER — Encounter: Payer: Self-pay | Admitting: Family Medicine

## 2018-09-03 LAB — SARS-COV-2 RNA, QUALITATIVE REAL-TIME RT-PCR: SARS CoV 2 RNA, RT PCR: NOT DETECTED

## 2018-09-04 ENCOUNTER — Encounter: Payer: Self-pay | Admitting: Family Medicine

## 2018-09-04 DIAGNOSIS — R059 Cough, unspecified: Secondary | ICD-10-CM

## 2018-09-04 DIAGNOSIS — R05 Cough: Secondary | ICD-10-CM

## 2018-09-05 ENCOUNTER — Ambulatory Visit (INDEPENDENT_AMBULATORY_CARE_PROVIDER_SITE_OTHER)
Admission: RE | Admit: 2018-09-05 | Discharge: 2018-09-05 | Disposition: A | Discharge status: Home / Self Care | Payer: 59 | Source: Ambulatory Visit | Attending: Family Medicine | Admitting: Family Medicine

## 2018-09-05 ENCOUNTER — Other Ambulatory Visit: Payer: Self-pay

## 2018-09-05 DIAGNOSIS — R0602 Shortness of breath: Secondary | ICD-10-CM | POA: Diagnosis not present

## 2018-09-05 DIAGNOSIS — R05 Cough: Secondary | ICD-10-CM

## 2018-09-05 DIAGNOSIS — R059 Cough, unspecified: Secondary | ICD-10-CM

## 2018-09-06 ENCOUNTER — Encounter: Payer: Self-pay | Admitting: Family Medicine

## 2018-12-07 ENCOUNTER — Other Ambulatory Visit: Payer: Self-pay

## 2018-12-07 DIAGNOSIS — Z20822 Contact with and (suspected) exposure to covid-19: Secondary | ICD-10-CM

## 2018-12-09 LAB — NOVEL CORONAVIRUS, NAA: SARS-CoV-2, NAA: NOT DETECTED

## 2018-12-31 ENCOUNTER — Other Ambulatory Visit: Payer: Self-pay | Admitting: Orthopedic Surgery

## 2019-01-01 ENCOUNTER — Other Ambulatory Visit: Payer: Self-pay

## 2019-01-01 ENCOUNTER — Other Ambulatory Visit
Admission: RE | Admit: 2019-01-01 | Discharge: 2019-01-01 | Disposition: A | Discharge status: Home / Self Care | Payer: 59 | Source: Ambulatory Visit | Attending: Orthopedic Surgery | Admitting: Orthopedic Surgery

## 2019-01-01 DIAGNOSIS — Z1159 Encounter for screening for other viral diseases: Secondary | ICD-10-CM | POA: Insufficient documentation

## 2019-01-01 LAB — CBC WITH DIFFERENTIAL/PLATELET
Abs Immature Granulocytes: 0.01 10*3/uL (ref 0.00–0.07)
Basophils Absolute: 0 10*3/uL (ref 0.0–0.1)
Basophils Relative: 1 %
Eosinophils Absolute: 0.3 10*3/uL (ref 0.0–0.5)
Eosinophils Relative: 5 %
HCT: 49.8 % (ref 39.0–52.0)
Hemoglobin: 17.1 g/dL — ABNORMAL HIGH (ref 13.0–17.0)
Immature Granulocytes: 0 %
Lymphocytes Relative: 40 %
Lymphs Abs: 2.4 10*3/uL (ref 0.7–4.0)
MCH: 33.7 pg (ref 26.0–34.0)
MCHC: 34.3 g/dL (ref 30.0–36.0)
MCV: 98 fL (ref 80.0–100.0)
Monocytes Absolute: 0.6 10*3/uL (ref 0.1–1.0)
Monocytes Relative: 10 %
Neutro Abs: 2.6 10*3/uL (ref 1.7–7.7)
Neutrophils Relative %: 44 %
Platelets: 256 10*3/uL (ref 150–400)
RBC: 5.08 MIL/uL (ref 4.22–5.81)
RDW: 11 % — ABNORMAL LOW (ref 11.5–15.5)
WBC: 5.9 10*3/uL (ref 4.0–10.5)
nRBC: 0 % (ref 0.0–0.2)

## 2019-01-01 LAB — BASIC METABOLIC PANEL
Anion gap: 12 (ref 5–15)
BUN: 13 mg/dL (ref 6–20)
CO2: 23 mmol/L (ref 22–32)
Calcium: 9.4 mg/dL (ref 8.9–10.3)
Chloride: 104 mmol/L (ref 98–111)
Creatinine, Ser: 0.82 mg/dL (ref 0.61–1.24)
GFR calc Af Amer: >60 mL/min (ref >60)
GFR calc non Af Amer: >60 mL/min (ref >60)
Glucose, Bld: 115 mg/dL — ABNORMAL HIGH (ref 70–99)
Potassium: 3.7 mmol/L (ref 3.5–5.1)
Sodium: 139 mmol/L (ref 135–145)

## 2019-01-01 LAB — PROTIME-INR
INR: 0.9 (ref 0.8–1.2)
Prothrombin Time: 12.5 seconds (ref 11.4–15.2)

## 2019-01-01 LAB — APTT: aPTT: 27 seconds (ref 24–36)

## 2019-01-01 LAB — SARS CORONAVIRUS 2 (TAT 6-24 HRS): SARS Coronavirus 2: NEGATIVE

## 2019-01-02 ENCOUNTER — Encounter
Admission: RE | Admit: 2019-01-02 | Discharge: 2019-01-02 | Disposition: A | Discharge status: Home / Self Care | Payer: 59 | Source: Ambulatory Visit | Attending: Orthopedic Surgery | Admitting: Orthopedic Surgery

## 2019-01-02 ENCOUNTER — Encounter: Payer: Self-pay | Admitting: Family Medicine

## 2019-01-02 ENCOUNTER — Other Ambulatory Visit: Payer: Self-pay

## 2019-01-02 DIAGNOSIS — S8261XD Displaced fracture of lateral malleolus of right fibula, subsequent encounter for closed fracture with routine healing: Secondary | ICD-10-CM | POA: Insufficient documentation

## 2019-01-02 NOTE — Patient Instructions (Signed)
Your procedure is scheduled on: 01/03/19 1000 AM Report to Day Surgery. MEDICAL MALL SECOND FLOOR   Remember: Instructions that are not followed completely may result in serious medical risk,  up to and including death, or upon the discretion of your surgeon and anesthesiologist your  surgery may need to be rescheduled.     _X__ 1. Do not eat food after midnight the night before your procedure.                 No gum chewing or hard candies. You may drink clear liquids up to 2 hours                 before you are scheduled to arrive for your surgery- DO not drink clear                 liquids within 2 hours of the start of your surgery.                 Clear Liquids include:  water, apple juice without pulp, clear carbohydrate                 drink such as Clearfast of Gatorade, Black Coffee or Tea (Do not add                 anything to coffee or tea).  __X__2.  On the morning of surgery brush your teeth with toothpaste and water, you                may rinse your mouth with mouthwash if you wish.  Do not swallow any toothpaste of mouthwash.     _X__ 3.  No Alcohol for 24 hours before or after surgery.   _X__ 4.  Do Not Smoke or use e-cigarettes For 24 Hours Prior to Your Surgery.                 Do not use any chewable tobacco products for at least 6 hours prior to                 surgery.  ____  5.  Bring all medications with you on the day of surgery if instructed.   __X__  6.  Notify your doctor if there is any change in your medical condition      (cold, fever, infections).     Do not wear jewelry, make-up, hairpins, clips or nail polish. Do not wear lotions, powders, or perfumes. You may wear deodorant. Do not shave 48 hours prior to surgery. Men may shave face and neck. Do not bring valuables to the hospital.    [facility] is not responsible for any belongings or valuables.  Contacts, dentures or bridgework may not be worn into surgery. Leave your  suitcase in the car. After surgery it may be brought to your room. For patients admitted to the hospital, discharge time is determined by your treatment team.   Patients discharged the day of surgery will not be allowed to drive home.            ____ Take these medicines the morning of surgery with A SIP OF WATER:    1. PAIN MEDICATION IF NEEDED  2.   3.   4.  5.  6.  ____ Fleet Enema (as directed)   ____ Use CHG Soap as directed  ____ Use inhalers on the day of surgery  ____ Stop metformin 2 days prior to surgery  ____ Take 1/2 of usual insulin dose the night before surgery. No insulin the morning          of surgery.   ____ Stop Coumadin/Plavix/aspirin on   ____ Stop Anti-inflammatories on    ____ Stop supplements until after surgery.    ____ Bring C-Pap to the hospital.

## 2019-01-02 NOTE — Pre-Procedure Instructions (Signed)
Tony Peck, [name], MD  Physician  Cardiology  Progress Notes    Signed  Encounter Date:  12/13/2017          Signed      Expand All Collapse All    Show:Clear all [x] Manual[x] Template[] Copied  Added by: [x] End, Cristal Deerhristopher, MD  [] Hover for details   New Outpatient Visit Date: 12/13/2017  Primary Care Provider: Eustaquio BoydenGutierrez, Javier, MD [address] [place], [place] [id number]  Chief Complaint: Dizziness and elevated blood pressure  HPI:  Tony Peck is a 28 y.o. male who is being seen today for the evaluation of dizziness and elevated blood pressures followed ED visit earlier this month. He has a history of hypertension dating back to his teenage years as well as vasovagal syncope (followed at [facility] as an adolescent with prior echo and tilt table testing).  Tony Peck reports intermittent dizziness and syncope dating back several years ago.  He last passed out last summer after hiking in the heat.  While eating, he became lightheaded and diaphoretic, subsequently losing consciousness for a few seconds.  On 12/06/17, Tony Peck was standing in line, getting his lunch, when he became progressively more dizzy.  He felt as though his vision was lagging and things were moving around him.  He was ultimately able to stumble back to his office with the assistance of a coworker.  He checked his blood pressure and found it to be 162/94.  He also experienced a dull headache at the time, as well as double vision.  He has experienced similar episodes for years, though typically not as severe.  The dizziness often lasts several hours and then is followed by a headache that lasts most of the day.  Episodes are sometimes accompanied by a racing heart and skipped beats.  He notes "random" shortness of breath that sometimes presents with the dizziness (though not always).  Tony Peck exercises regularly and also walks his dogs.  He has felt a bit dizzy/off-balance when walking his dogs, though he has  never passed out with exertion.  He denies chest pain.  In the ED on 6/19, he was noted to be quite hypertensive.  Tony Peck was started on metoprolol several years ago but did not tolerate this due to fatigue.  He switched to benazepril with reasonable blood pressure control.  He "weaned" himself off this in college, as he made changes in his diet to help lower his blood pressure.  He has been off medications for several years.  He notes that his blood pressure had been normal up until a few months ago, when it began climbing again.  --------------------------------------------------------------------------------------------------  Cardiovascular History & Procedures: Cardiovascular Problems:  Dizziness and syncope (previously diagnosed with vasovagal syncope)  Hypertension  Risk Factors:  Hypertension and male gender  Cath/PCI:  None  CV Surgery:  None  EP Procedures and Devices:  Tilt table (08/06/07, [facility]): Normal study except for decreased baroreflex gain.  History and study findings suggestive with vasovagal syncope.  Non-Invasive Evaluation(s):  TTE (05/06/11, [facility]): No cardiac disease identified.  Normal LVEF.  No evidence of coarctation of the aorta or LVH.  Recent CV Pertinent Labs: Labs (Brief)       Lab Results  Component Value Date   CHOL 131 11/22/2013   HDL 46.20 11/22/2013   LDLCALC 75 11/22/2013   TRIG 48.0 11/22/2013   CHOLHDL 3 11/22/2013   K 3.3 (L) 12/06/2017   BUN 13 12/06/2017   CREATININE 0.81 12/06/2017      --------------------------------------------------------------------------------------------------  Past Medical History:  Diagnosis Date  . Hypertension 2009   has seen cardiology  . Marijuana use   . Seasonal allergies   . Umbilical hernia   . Vasovagal syncope 2012   saw cards         Past Surgical History:  Procedure Laterality Date  . HERNIA REPAIR  09/19/33   umbilical henira  .  MYRINGOTOMY Bilateral 1998  . NASAL SINUS SURGERY  2000  . TONSILLECTOMY AND ADENOIDECTOMY  1997    Active Medications      Current Meds  Medication Sig  . cetirizine (ZYRTEC) 10 MG tablet Take 10 mg by mouth daily.  . Multiple Vitamins-Minerals (EMERGEN-C VITAMIN C PO) Take by mouth as directed.      Allergies: Adhesive [tape]  Social History        Tobacco Use  . Smoking status: Never Smoker  . Smokeless tobacco: Never Used  Substance Use Topics  . Alcohol use: Yes    Comment: occasionally  . Drug use: Yes    Comment: MJ-occasional         Family History  Problem Relation Age of Onset  . Hypertension Other        grandparents  . Hypertension Brother   . CAD Maternal Grandfather 61       MI  . Kidney disease Maternal Grandfather   . Diabetes Maternal Grandfather   . Cancer Maternal Grandfather        colon    Review of Systems: A 12-system review of systems was performed and was negative except as noted in the HPI.  --------------------------------------------------------------------------------------------------  Physical Exam: BP (!) 158/72 (BP Location: Right Arm, Patient Position: Sitting, Cuff Size: Normal)   Pulse 64   Ht 6\' 3"  (1.905 m)   Wt 202 lb 8 oz (91.9 kg)   BMI 25.31 kg/m   General:  NAD.  Accompanied by his mother. HEENT: No conjunctival pallor or scleral icterus. Moist mucous membranes. OP clear. Neck: Supple without lymphadenopathy, thyromegaly, JVD, or HJR. No carotid bruit. Lungs: Normal work of breathing. Clear to auscultation bilaterally without wheezes or crackles. Heart: Regular rate and rhythm without murmurs, rubs, or gallops. Non-displaced PMI. Abd: Bowel sounds present. Soft, NT/ND without hepatosplenomegaly Ext: No lower extremity edema. Radial, PT, and DP pulses are 2+ bilaterally Skin: Warm and dry without rash. Neuro: CNIII-XII intact. Strength and fine-touch sensation intact in upper and lower  extremities bilaterally. Psych: Normal mood and affect.  EKG:  NSR without abnormalities.  Recent Labs       Lab Results  Component Value Date   WBC 6.6 12/06/2017   HGB 15.3 12/06/2017   HCT 43.5 12/06/2017   MCV 97.3 12/06/2017   PLT 231 12/06/2017      Recent Labs       Lab Results  Component Value Date   NA 138 12/06/2017   K 3.3 (L) 12/06/2017   CL 104 12/06/2017   CO2 26 12/06/2017   BUN 13 12/06/2017   CREATININE 0.81 12/06/2017   GLUCOSE 132 (H) 12/06/2017      Recent Labs       Lab Results  Component Value Date   CHOL 131 11/22/2013   HDL 46.20 11/22/2013   LDLCALC 75 11/22/2013   TRIG 48.0 11/22/2013   CHOLHDL 3 11/22/2013       --------------------------------------------------------------------------------------------------  ASSESSMENT AND PLAN: Dizziness/syncope and palpitations Mr. Okray describes at least 2 different events.  He frequently notes vision changes and feeling off  balance, the he has also noted lightheadedness and brief syncope.  The latter is most consistent with vasovagal syncope.  His other dizziness is non-specific and may well be non-cardiac.  Exam today is unrevealing.  [facility]up at [facility] (2009-2012) was also benign (at least 3 echoes and tilt table testing).  Given recurrent symptoms, we have agreed to obtain an echo and 48 hour Holter monitor.  If this workup is unrevealing, exercise tolerance test to exclude ischemia (unlikely given age) and exercise-induced arrhythmias will be considered.  Evaluation for underlying neurologic cause should also be considered, particularly if aforementioned workup is negative.  Hypertension Blood pressure modestly elevated today without significant orthostatic changes.  Given young age and history of hypertension dating back to his teen years, we will proceed with secondary hypertension workup, including renal artery Doppler, serum aldosterone/plasma renin activity, and  24 hour urine catecholamines/metanephrines.  I have encouraged sodium restriction.  We will defer medical therapy pending aforementioned workup.  Shortness of breath This happens randomly and is intermittently associated with palpitations.  We will proceed with echo and Holter monitor, as above.  Follow-up: Return to clinic in 6 weeks.  Tony Kendallhristopher End, MD 12/13/2017 2:13 PM         Electronically signed by Tony Peck, [name], MD at 12/14/2017 2:38 PM   Office Visit on 12/13/2017     Detailed Report     Note shared with patient

## 2019-01-03 ENCOUNTER — Ambulatory Visit: Payer: 59

## 2019-01-03 ENCOUNTER — Other Ambulatory Visit: Payer: Self-pay

## 2019-01-03 ENCOUNTER — Ambulatory Visit: Payer: 59 | Admitting: Certified Registered Nurse Anesthetist

## 2019-01-03 ENCOUNTER — Encounter: Payer: Self-pay | Admitting: Certified Registered Nurse Anesthetist

## 2019-01-03 ENCOUNTER — Encounter
Admission: RE | Disposition: A | Discharge status: Home / Self Care | Payer: Self-pay | Source: Home / Self Care | Attending: Orthopedic Surgery

## 2019-01-03 ENCOUNTER — Ambulatory Visit
Admission: RE | Admit: 2019-01-03 | Discharge: 2019-01-03 | Disposition: A | Discharge status: Home / Self Care | Payer: 59 | Attending: Orthopedic Surgery | Admitting: Orthopedic Surgery

## 2019-01-03 DIAGNOSIS — W109XXA Fall (on) (from) unspecified stairs and steps, initial encounter: Secondary | ICD-10-CM | POA: Diagnosis not present

## 2019-01-03 DIAGNOSIS — S82899A Other fracture of unspecified lower leg, initial encounter for closed fracture: Secondary | ICD-10-CM

## 2019-01-03 DIAGNOSIS — I10 Essential (primary) hypertension: Secondary | ICD-10-CM | POA: Diagnosis not present

## 2019-01-03 DIAGNOSIS — S8261XD Displaced fracture of lateral malleolus of right fibula, subsequent encounter for closed fracture with routine healing: Secondary | ICD-10-CM

## 2019-01-03 DIAGNOSIS — S8261XA Displaced fracture of lateral malleolus of right fibula, initial encounter for closed fracture: Secondary | ICD-10-CM | POA: Insufficient documentation

## 2019-01-03 HISTORY — PX: ORIF ANKLE FRACTURE: SHX5408

## 2019-01-03 LAB — URINE DRUG SCREEN, QUALITATIVE (ARMC ONLY)
Amphetamines, Ur Screen: NOT DETECTED
Barbiturates, Ur Screen: NOT DETECTED
Benzodiazepine, Ur Scrn: NOT DETECTED
Cannabinoid 50 Ng, Ur ~~LOC~~: POSITIVE — AB
Cocaine Metabolite,Ur ~~LOC~~: NOT DETECTED
MDMA (Ecstasy)Ur Screen: NOT DETECTED
Methadone Scn, Ur: NOT DETECTED
Opiate, Ur Screen: POSITIVE — AB
Phencyclidine (PCP) Ur S: NOT DETECTED
Tricyclic, Ur Screen: NOT DETECTED

## 2019-01-03 SURGERY — OPEN REDUCTION INTERNAL FIXATION (ORIF) ANKLE FRACTURE
Anesthesia: General | Laterality: Right

## 2019-01-03 MED ORDER — ACETAMINOPHEN 10 MG/ML IV SOLN
INTRAVENOUS | Status: DC | PRN
  Administered 2019-01-03: 1000 mg via INTRAVENOUS

## 2019-01-03 MED ORDER — BUPIVACAINE HCL 0.25 % IJ SOLN
INTRAMUSCULAR | Status: DC | PRN
  Administered 2019-01-03: 30 mL

## 2019-01-03 MED ORDER — FAMOTIDINE 20 MG PO TABS
ORAL_TABLET | ORAL | Status: AC
  Administered 2019-01-03: 11:00:00 20 mg via ORAL
  Filled 2019-01-03: qty 1

## 2019-01-03 MED ORDER — PROPOFOL 10 MG/ML IV BOLUS
INTRAVENOUS | Status: AC
  Filled 2019-01-03: qty 20

## 2019-01-03 MED ORDER — LIDOCAINE HCL (PF) 2 % IJ SOLN
INTRAMUSCULAR | Status: AC
  Filled 2019-01-03: qty 10

## 2019-01-03 MED ORDER — HYDROMORPHONE HCL 1 MG/ML IJ SOLN
INTRAMUSCULAR | Status: AC
  Administered 2019-01-03: 14:00:00 0.5 mg via INTRAVENOUS
  Filled 2019-01-03: qty 1

## 2019-01-03 MED ORDER — FAMOTIDINE 20 MG PO TABS
20.0000 mg | ORAL_TABLET | Freq: Once | ORAL | Status: AC
  Administered 2019-01-03: 11:00:00 20 mg via ORAL

## 2019-01-03 MED ORDER — KETOROLAC TROMETHAMINE 30 MG/ML IJ SOLN
30.0000 mg | Freq: Once | INTRAMUSCULAR | Status: AC | PRN
  Administered 2019-01-03: 14:00:00 30 mg via INTRAVENOUS

## 2019-01-03 MED ORDER — CHLORHEXIDINE GLUCONATE CLOTH 2 % EX PADS: 6.0000 | MEDICATED_PAD | Freq: Once | CUTANEOUS | Status: DC

## 2019-01-03 MED ORDER — KETAMINE HCL 50 MG/ML IJ SOLN
INTRAMUSCULAR | Status: DC | PRN
  Administered 2019-01-03: 50 mg via INTRAMUSCULAR

## 2019-01-03 MED ORDER — BUPIVACAINE HCL (PF) 0.25 % IJ SOLN
INTRAMUSCULAR | Status: AC
  Filled 2019-01-03: qty 30

## 2019-01-03 MED ORDER — NEOMYCIN-POLYMYXIN B GU 40-200000 IR SOLN
Status: DC | PRN
  Administered 2019-01-03: 2 mL

## 2019-01-03 MED ORDER — LACTATED RINGERS IV SOLN
INTRAVENOUS | Status: DC
  Administered 2019-01-03: 11:00:00 via INTRAVENOUS

## 2019-01-03 MED ORDER — CEFAZOLIN SODIUM-DEXTROSE 2-4 GM/100ML-% IV SOLN
2.0000 g | INTRAVENOUS | Status: AC
  Administered 2019-01-03: 2 g via INTRAVENOUS

## 2019-01-03 MED ORDER — OXYCODONE HCL 5 MG PO TABS
5.0000 mg | ORAL_TABLET | Freq: Once | ORAL | Status: AC | PRN
  Administered 2019-01-03: 14:00:00 5 mg via ORAL

## 2019-01-03 MED ORDER — ASPIRIN EC 325 MG PO TBEC: 325.0000 mg | DELAYED_RELEASE_TABLET | Freq: Every day | ORAL | 0 refills | Status: DC

## 2019-01-03 MED ORDER — CEFAZOLIN SODIUM-DEXTROSE 2-4 GM/100ML-% IV SOLN
INTRAVENOUS | Status: AC
  Filled 2019-01-03: qty 100

## 2019-01-03 MED ORDER — ONDANSETRON HCL 4 MG PO TABS: 4.0000 mg | ORAL_TABLET | Freq: Three times a day (TID) | ORAL | 0 refills | Status: DC | PRN

## 2019-01-03 MED ORDER — DEXAMETHASONE SODIUM PHOSPHATE 10 MG/ML IJ SOLN
INTRAMUSCULAR | Status: DC | PRN
  Administered 2019-01-03: 10 mg via INTRAVENOUS

## 2019-01-03 MED ORDER — ONDANSETRON HCL 4 MG/2ML IJ SOLN
INTRAMUSCULAR | Status: AC
  Filled 2019-01-03: qty 2

## 2019-01-03 MED ORDER — PROPOFOL 10 MG/ML IV BOLUS
INTRAVENOUS | Status: DC | PRN
  Administered 2019-01-03: 200 mg via INTRAVENOUS

## 2019-01-03 MED ORDER — HYDROMORPHONE HCL 1 MG/ML IJ SOLN
0.2500 mg | INTRAMUSCULAR | Status: DC | PRN
  Administered 2019-01-03 (×4): 0.5 mg via INTRAVENOUS

## 2019-01-03 MED ORDER — MIDAZOLAM HCL 2 MG/2ML IJ SOLN
INTRAMUSCULAR | Status: DC | PRN
  Administered 2019-01-03: 2 mg via INTRAVENOUS

## 2019-01-03 MED ORDER — FENTANYL CITRATE (PF) 250 MCG/5ML IJ SOLN
INTRAMUSCULAR | Status: AC
  Filled 2019-01-03: qty 5

## 2019-01-03 MED ORDER — PROMETHAZINE HCL 25 MG/ML IJ SOLN: 6.2500 mg | INTRAMUSCULAR | Status: DC | PRN

## 2019-01-03 MED ORDER — KETAMINE HCL 50 MG/ML IJ SOLN
INTRAMUSCULAR | Status: AC
  Filled 2019-01-03: qty 10

## 2019-01-03 MED ORDER — ONDANSETRON HCL 4 MG/2ML IJ SOLN
INTRAMUSCULAR | Status: DC | PRN
  Administered 2019-01-03: 4 mg via INTRAVENOUS

## 2019-01-03 MED ORDER — DEXAMETHASONE SODIUM PHOSPHATE 10 MG/ML IJ SOLN
INTRAMUSCULAR | Status: AC
  Filled 2019-01-03: qty 1

## 2019-01-03 MED ORDER — MIDAZOLAM HCL 2 MG/2ML IJ SOLN
INTRAMUSCULAR | Status: AC
  Filled 2019-01-03: qty 2

## 2019-01-03 MED ORDER — OXYCODONE HCL 5 MG PO TABS: 5.0000 mg | ORAL_TABLET | ORAL | 0 refills | Status: DC | PRN

## 2019-01-03 MED ORDER — NEOMYCIN-POLYMYXIN B GU 40-200000 IR SOLN
Status: AC
  Filled 2019-01-03: qty 2

## 2019-01-03 MED ORDER — OXYCODONE HCL 5 MG/5ML PO SOLN: 5.0000 mg | Freq: Once | ORAL | Status: AC | PRN

## 2019-01-03 MED ORDER — FENTANYL CITRATE (PF) 100 MCG/2ML IJ SOLN
INTRAMUSCULAR | Status: AC
  Filled 2019-01-03: qty 2

## 2019-01-03 MED ORDER — ACETAMINOPHEN 10 MG/ML IV SOLN
INTRAVENOUS | Status: AC
  Filled 2019-01-03: qty 100

## 2019-01-03 MED ORDER — OXYCODONE HCL 5 MG PO TABS
ORAL_TABLET | ORAL | Status: AC
  Filled 2019-01-03: qty 1

## 2019-01-03 MED ORDER — ACETAMINOPHEN 325 MG PO TABS: 325.0000 mg | ORAL_TABLET | ORAL | Status: DC | PRN

## 2019-01-03 MED ORDER — SEVOFLURANE IN SOLN
RESPIRATORY_TRACT | Status: AC
  Filled 2019-01-03: qty 250

## 2019-01-03 MED ORDER — MEPERIDINE HCL 50 MG/ML IJ SOLN: 6.2500 mg | INTRAMUSCULAR | Status: DC | PRN

## 2019-01-03 MED ORDER — ACETAMINOPHEN 160 MG/5ML PO SOLN
325.0000 mg | ORAL | Status: DC | PRN
  Filled 2019-01-03: qty 20.3

## 2019-01-03 MED ORDER — KETOROLAC TROMETHAMINE 30 MG/ML IJ SOLN
INTRAMUSCULAR | Status: AC
  Administered 2019-01-03: 14:00:00 30 mg via INTRAVENOUS
  Filled 2019-01-03: qty 1

## 2019-01-03 MED ORDER — LIDOCAINE HCL (CARDIAC) PF 100 MG/5ML IV SOSY
PREFILLED_SYRINGE | INTRAVENOUS | Status: DC | PRN
  Administered 2019-01-03: 100 mg via INTRAVENOUS

## 2019-01-03 MED ORDER — FENTANYL CITRATE (PF) 100 MCG/2ML IJ SOLN
INTRAMUSCULAR | Status: DC | PRN
  Administered 2019-01-03: 50 ug via INTRAVENOUS
  Administered 2019-01-03: 25 ug via INTRAVENOUS
  Administered 2019-01-03 (×2): 50 ug via INTRAVENOUS
  Administered 2019-01-03: 25 ug via INTRAVENOUS
  Administered 2019-01-03: 50 ug via INTRAVENOUS

## 2019-01-03 SURGICAL SUPPLY — 47 items
BANDAGE ELASTIC 4 LF NS (GAUZE/BANDAGES/DRESSINGS) ×4 IMPLANT
BLADE SURG 15 STRL LF DISP TIS (BLADE) ×1 IMPLANT
BLADE SURG 15 STRL SS (BLADE) ×1
BNDG ESMARK 6X12 TAN STRL LF (GAUZE/BANDAGES/DRESSINGS) ×2 IMPLANT
COVER WAND RF STERILE (DRAPES) ×2 IMPLANT
CUFF TOURN SGL QUICK 24 (TOURNIQUET CUFF)
CUFF TOURN SGL QUICK 30 (TOURNIQUET CUFF)
CUFF TRNQT CYL 24X4X16.5-23 (TOURNIQUET CUFF) IMPLANT
CUFF TRNQT CYL 30X4X21-28X (TOURNIQUET CUFF) IMPLANT
DRAPE FLUOR MINI C-ARM 54X84 (DRAPES) ×2 IMPLANT
DRAPE INCISE IOBAN 66X45 STRL (DRAPES) ×2 IMPLANT
DRAPE U-SHAPE 47X51 STRL (DRAPES) ×2 IMPLANT
DURAPREP 26ML APPLICATOR (WOUND CARE) ×4 IMPLANT
ELECT REM PT RETURN 9FT ADLT (ELECTROSURGICAL) ×2
ELECTRODE REM PT RTRN 9FT ADLT (ELECTROSURGICAL) ×1 IMPLANT
GAUZE SPONGE 4X4 12PLY STRL (GAUZE/BANDAGES/DRESSINGS) ×2 IMPLANT
GAUZE XEROFORM 1X8 LF (GAUZE/BANDAGES/DRESSINGS) ×2 IMPLANT
GLOVE BIOGEL PI IND STRL 9 (GLOVE) ×1 IMPLANT
GLOVE BIOGEL PI INDICATOR 9 (GLOVE) ×1
GLOVE SURG 9.0 ORTHO LTXF (GLOVE) ×4 IMPLANT
GOWN STRL REUS TWL 2XL XL LVL4 (GOWN DISPOSABLE) ×2 IMPLANT
GOWN STRL REUS W/ TWL LRG LVL3 (GOWN DISPOSABLE) ×1 IMPLANT
GOWN STRL REUS W/TWL LRG LVL3 (GOWN DISPOSABLE) ×1
KIT TURNOVER KIT A (KITS) ×2 IMPLANT
LABEL OR SOLS (LABEL) ×2 IMPLANT
NS IRRIG 1000ML POUR BTL (IV SOLUTION) ×2 IMPLANT
PACK EXTREMITY ARMC (MISCELLANEOUS) ×2 IMPLANT
PAD ABD DERMACEA PRESS 5X9 (GAUZE/BANDAGES/DRESSINGS) ×12 IMPLANT
PAD CAST CTTN 4X4 STRL (SOFTGOODS) ×3 IMPLANT
PADDING CAST COTTON 4X4 STRL (SOFTGOODS) ×3
PLATE LCP 3.5 1/3 TUB 8HX93 (Plate) ×2 IMPLANT
SCREW CANC FT 4.0X20 (Screw) ×2 IMPLANT
SCREW CANC FT ST SFS 4X16 (Screw) ×2 IMPLANT
SCREW CANC FT/18 4.0 (Screw) ×4 IMPLANT
SCREW CORTEX 3.5 14MM (Screw) ×4 IMPLANT
SCREW CORTEX 3.5 28MM (Screw) ×1 IMPLANT
SCREW LOCK CORT ST 3.5X14 (Screw) ×4 IMPLANT
SCREW LOCK CORT ST 3.5X28 (Screw) ×1 IMPLANT
SPLINT CAST 1 STEP 4X30 (MISCELLANEOUS) ×4 IMPLANT
SPONGE LAP 18X18 RF (DISPOSABLE) ×2 IMPLANT
STAPLER SKIN PROX 35W (STAPLE) ×2 IMPLANT
STOCKINETTE STRL 6IN 960660 (GAUZE/BANDAGES/DRESSINGS) ×2 IMPLANT
STRIP CLOSURE SKIN 1/2X4 (GAUZE/BANDAGES/DRESSINGS) ×4 IMPLANT
SUT VIC AB 2-0 SH 27 (SUTURE) ×2
SUT VIC AB 2-0 SH 27XBRD (SUTURE) ×2 IMPLANT
SYR 30ML LL (SYRINGE) ×2 IMPLANT
TAPE MICROPORE 2IN (TAPE) ×2 IMPLANT

## 2019-01-03 NOTE — Discharge Instructions (Signed)

## 2019-01-03 NOTE — Transfer of Care (Signed)
Immediate Anesthesia Transfer of Care Note  Patient: Tony Peck  Procedure(s) Performed: OPEN REDUCTION INTERNAL FIXATION (ORIF) ANKLE FRACTURE (Right )  Patient Location: PACU  Anesthesia Type:General  Level of Consciousness: drowsy  Airway & Oxygen Therapy: Patient Spontanous Breathing and Patient connected to nasal cannula oxygen  Post-op Assessment: Report given to RN and Post -op Vital signs reviewed and stable  Post vital signs: Reviewed and stable  Last Vitals:  Vitals Value Taken Time  BP 160/91 01/03/19 1324  Temp    Pulse 122 01/03/19 1328  Resp 15 01/03/19 1328  SpO2 99 % 01/03/19 1328  Vitals shown include unvalidated device data.  Last Pain:  Vitals:   01/03/19 1033  TempSrc: Tympanic  PainSc: 0-No pain         Complications: No apparent anesthesia complications

## 2019-01-03 NOTE — H&P (Signed)
PREOPERATIVE H&P  Chief Complaint: S82.61XA Displaced fracture of lateral malleolus of right fibula, initial encounter closed fracture  HPI: Tony Peck is a 28 y.o. male who presents for fixation of a displaced fracture of lateral malleolus of right fibula, initial encounter closed fracture.  Patient injured his right ankle coming down stairs.  He sustained an inversion injury to his right ankle.  He was initially evaluated at the [facility] ER where he was placed in a splint.  Upon follow-up to our office the fracture had further displaced.  He had talar subluxation laterally.  The decision was made to proceed with open reduction internal fixation of the lateral malleolus fracture.  Patient was seen in the clinic with his mother.   They agreed with surgical management.   Past Medical History:  Diagnosis Date  . Hypertension 2009   has seen cardiology  . Marijuana use   . Seasonal allergies   . Umbilical hernia   . Vasovagal syncope 2012   saw cards   Past Surgical History:  Procedure Laterality Date  . HERNIA REPAIR  07/23/53   umbilical henira  . MYRINGOTOMY Bilateral 1998  . NASAL SINUS SURGERY  2000  . TONSILLECTOMY AND ADENOIDECTOMY  1997   Social History   Socioeconomic History  . Marital status: Single    Spouse name: Not on file  . Number of children: Not on file  . Years of education: Not on file  . Highest education level: Not on file  Occupational History  . Not on file  Social Needs  . Financial resource strain: Not on file  . Food insecurity    Worry: Not on file    Inability: Not on file  . Transportation needs    Medical: Not on file    Non-medical: Not on file  Tobacco Use  . Smoking status: Never Smoker  . Smokeless tobacco: Never Used  Substance and Sexual Activity  . Alcohol use: Yes    Alcohol/week: 14.0 standard drinks    Types: 14 Glasses of wine per week  . Drug use: Not Currently    Comment: MJ-in the past  . Sexual activity: Not on file   Lifestyle  . Physical activity    Days per week: Not on file    Minutes per session: Not on file  . Stress: Not on file  Relationships  . Social Herbalist on phone: Not on file    Gets together: Not on file    Attends religious service: Not on file    Active member of club or organization: Not on file    Attends meetings of clubs or organizations: Not on file    Relationship status: Not on file  Other Topics Concern  . 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 Hypertension Mother   . Hypertension Father   . Hypertension Other        grandparents  . Hypertension Brother   . CAD Maternal Grandfather 89       MI  . Kidney disease Maternal Grandfather   . Diabetes Maternal Grandfather   . Cancer Maternal Grandfather  colon  . Heart attack Maternal Grandfather   . Arrhythmia Maternal Grandmother        s/p PCI  . Heart failure Maternal Grandmother   . Atrial fibrillation Paternal Grandmother   . Sudden Cardiac Death Neg Hx    Allergies  Allergen Reactions  . Adhesive [Tape] Rash   Prior to Admission medications   Medication Sig Start Date End Date Taking? Authorizing Provider  cetirizine (ZYRTEC) 10 MG tablet Take 10 mg by mouth daily.   Yes [provider]  docusate sodium (COLACE) 100 MG capsule Take 100 mg by mouth 3 (three) times daily as needed for mild constipation.   Yes [provider]  oxyCODONE-acetaminophen (PERCOCET/ROXICET) 5-325 MG tablet Take 1 tablet by mouth every 4 (four) hours as needed for pain. 12/29/18  Yes [provider]     Positive ROS: All other systems have been reviewed and were otherwise negative with the exception of those mentioned in the  HPI and as above.  Physical Exam: General: Alert, no acute distress Cardiovascular: Regular rate and rhythm, no murmurs rubs or gallops.  No pedal edema Respiratory: Clear to auscultation bilaterally, no wheezes rales or rhonchi. No cyanosis, no use of accessory musculature GI: No organomegaly, abdomen is soft and non-tender nondistended with positive bowel sounds. Skin: Skin intact, no lesions within the operative field. Neurologic: Sensation intact distally Psychiatric: Patient is competent for consent with normal mood and affect Lymphatic: No cervical lymphadenopathy  MUSCULOSKELETAL: Right lower extremity: Patient skin is intact.  He is neurovascular intact.  Patient had no significant deformity.  He can flex and extend his toes.  His toes are well-perfused.  Patient has intact sensation to light touch in all 5 toes in the first dorsal webspace.  Assessment: S82.61XA Displaced fracture of lateral malleolus of right fibula, initial encounter closed fracture  Plan: Plan for Procedure(s): OPEN REDUCTION INTERNAL FIXATION (ORIF) RIGHT LATERAL MALLEOLUS FRACTURE  I discussed the details of the operation as well as the postoperative course with the patient and his mother in my office prior to surgery.  I discussed the risks and benefits of surgery. The risks include but are not limited to infection, bleeding, nerve or blood vessel injury, joint stiffness or loss of motion, persistent pain, weakness or instability, malunion, nonunion and hardware failure and the need for further surgery. Medical risks include but are not limited to DVT and pulmonary embolism, myocardial infarction, stroke, pneumonia, respiratory failure and death. Patient understood these risks and wished to proceed.    Juanell FairlyKevin Giabella Duhart, MD   01/03/2019 11:25 AM

## 2019-01-03 NOTE — Op Note (Signed)
01/03/2019  2:11 PM  PATIENT:  Tony Peck    PRE-OPERATIVE DIAGNOSIS:  S82.61XA Displaced fracture of lateral malleolus of right fibula, initial encounter closed fracture  POST-OPERATIVE DIAGNOSIS:  Same  PROCEDURE:  OPEN REDUCTION INTERNAL FIXATION (ORIF) LATERAL MALLEOLUS FRACTURE, RIGHT ANKLE   SURGEON:  Thornton Park, MD  ANESTHESIA:   General  EBL: Minimal   TOURNIQUET TIME: 73 minutes  PREOPERATIVE INDICATIONS:  Tony Peck is a  28 y.o. male with a diagnosis of S82.61XA Displaced fracture of lateral malleolus of right fibula, initial encounter closed fracture who failed conservative measures and elected for surgical management.    I discussed the risks and benefits of surgery. The risks include but are not limited to infection, bleeding requiring blood transfusion, nerve or blood vessel injury, joint stiffness or loss of motion, persistent pain, weakness or instability, malunion, nonunion and hardware failure and the need for further surgery. Medical risks include but are not limited to DVT and pulmonary embolism, myocardial infarction, stroke, pneumonia, respiratory failure and death. Patient understood these risks and wished to proceed.   OPERATIVE IMPLANTS: Synthes one third tubular 8 hole plate for lateral fixation with combination of 3.5 cortical and 4.0 cancellus screws  OPERATIVE FINDINGS: Patient had short oblique fracture of the lateral malleolus with lateral talar subluxation  OPERATIVE PROCEDURE:   Patient was met in the preoperative area. The right leg was signed my initials and the word yes according the hospital's correct site of surgery protocol.  He was brought to the operating room where he underwent general anesthesia. The patient was placed supine on the operative table. A bump was placed under the right hip. A tourniquet was applied to the right thigh.  The lower extremity was prepped and draped in a sterile fashion. A timeout was performed to verify the  patient's name, date of birth, medical record number, correct site of surgery and correct procedure to be performed. It was also used to verify the patient received antibiotics, and that all appropriate instruments, implants and radiographic studies were available in the room. Once all in attendance were in agreement, the case began.  The right lower extremity was exsanguinated with an Esmarch. The tourniquet was inflated to 275 mmHg. This was applied for a total of 73 minutes. A lateral incision was made over the fibula. The subcutaneous tissues were dissected with the Metzenbaum scissor and pickup. Care was taken to avoid injury to the superficial peroneal nerve. The lateral malleolus fracture was identified and irrigated and fracture hematoma was evacuated. Soft tissue was removed from the fracture site using a periosteal elevator. A fracture reduction clamp was then used to reduce the fracture to an anatomic position.  The position of fracture reduction was confirmed on FluoroScan imaging.     The lateral malleolus was then drilled in an AP direction, perpendicular to the fracture site to allow for placement of the lag screw.   A single lag screw, 28 mm in length, was advanced across the fracture site by hand. This compressed the fracture.   An 8 hole, 1/3 tubular plate was then contoured and placed along the lateral fibula. Bicortical screws x 4were placed proximal to the fracture and fully threaded cancellus screws were placed distal the fracture x 3. The fracture reduction and hardware placement were confirmed on AP and lateral imaging.  A stress test of the right ankle was then performed under fluoroscopy.  This test did not reveal any syndesmotic injury or opening of the medial  clear space.  The lateral incision was then copiously irrigated. The deep tissue was closed with 0 vicryl, thesubcutaneous tissue was closed with 2-0 Vicryl and the skin approximated staples. A dry sterile dressing was  applied along with an AO splint. The patient's ankle was positioned in neutral. The pateint was then awoken from anesthesia, transferred to hospital bed and brought to the PACU in stable condition. I was scrubbed and present the entire case and all sharp, sponge and instrument counts were correct at conclusion the case.   I spoke to the patient's mother by phone from the PACU, due to COVID-19 restrictions, to let them her know the case was performed without complication and her son was stable in recovery room.    [name], MD

## 2019-01-03 NOTE — Anesthesia Post-op Follow-up Note (Signed)
Anesthesia QCDR form completed.        

## 2019-01-03 NOTE — Anesthesia Procedure Notes (Signed)
Procedure Name: LMA Insertion Date/Time: 01/03/2019 11:32 AM Performed by: Eben Burow, CRNA Pre-anesthesia Checklist: Patient identified, Emergency Drugs available, Suction available and Patient being monitored Patient Re-evaluated:Patient Re-evaluated prior to induction Oxygen Delivery Method: Circle system utilized Preoxygenation: Pre-oxygenation with 100% oxygen Induction Type: IV induction LMA: LMA inserted LMA Size: 5.0 Number of attempts: 1 Placement Confirmation: positive ETCO2 and breath sounds checked- equal and bilateral Tube secured with: Tape Dental Injury: Teeth and Oropharynx as per pre-operative assessment

## 2019-01-03 NOTE — Anesthesia Preprocedure Evaluation (Addendum)
Anesthesia Evaluation  Patient identified by MRN, date of birth, ID band Patient awake    Reviewed: Allergy & Precautions, H&P , NPO status , reviewed documented beta blocker date and time   Airway Mallampati: II  TM Distance: >3 FB Neck ROM: full    Dental  (+) Teeth Intact   Pulmonary shortness of breath,  SOB w dizziness, complete eval w echo. Pt states "stress", work up negative   Pulmonary exam normal        Cardiovascular hypertension, Normal cardiovascular exam     Neuro/Psych    GI/Hepatic neg GERD  ,  Endo/Other    Renal/GU      Musculoskeletal   Abdominal   Peds  Hematology   Anesthesia Other Findings Past Medical History: 2009: Hypertension     Comment:  has seen cardiology No date: Marijuana use No date: Seasonal allergies No date: Umbilical hernia 6433: Vasovagal syncope     Comment:  saw cards Past Surgical History: 11/26/13: HERNIA REPAIR     Comment:  umbilical henira 2951: MYRINGOTOMY; Bilateral 2000: NASAL SINUS SURGERY 1997: TONSILLECTOMY AND ADENOIDECTOMY  UDS pos for opiods (taking percocet) and THC, discussed, will procede  Reproductive/Obstetrics                           Anesthesia Physical Anesthesia Plan  ASA: II  Anesthesia Plan: General   Post-op Pain Management:    Induction: Intravenous  PONV Risk Score and Plan: Ondansetron and Treatment may vary due to age or medical condition  Airway Management Planned: LMA  Additional Equipment:   Intra-op Plan:   Post-operative Plan: Extubation in OR  Informed Consent: I have reviewed the patients History and Physical, chart, labs and discussed the procedure including the risks, benefits and alternatives for the proposed anesthesia with the patient or authorized representative who has indicated his/her understanding and acceptance.     Dental Advisory Given  Plan Discussed with: CRNA  Anesthesia  Plan Comments:         Anesthesia Quick Evaluation

## 2019-01-04 ENCOUNTER — Encounter: Payer: Self-pay | Admitting: Orthopedic Surgery

## 2019-01-04 NOTE — Anesthesia Postprocedure Evaluation (Signed)
Anesthesia Post Note  Patient: FORTUNATO NORDIN  Procedure(s) Performed: OPEN REDUCTION INTERNAL FIXATION (ORIF) ANKLE FRACTURE (Right )  Patient location during evaluation: PACU Anesthesia Type: General Level of consciousness: awake and alert Pain management: pain level controlled Vital Signs Assessment: post-procedure vital signs reviewed and stable Respiratory status: spontaneous breathing, nonlabored ventilation and respiratory function stable Cardiovascular status: blood pressure returned to baseline and stable Postop Assessment: no apparent nausea or vomiting Anesthetic complications: no     Last Vitals:  Vitals:   01/03/19 1443 01/03/19 1444  BP:  (!) 161/80  Pulse:  70  Resp:  16  Temp: 36.5 C   SpO2:  98%    Last Pain:  Vitals:   01/03/19 1444  TempSrc:   PainSc: 5                  Sabriah Hobbins Harvie Heck

## 2019-02-19 ENCOUNTER — Other Ambulatory Visit: Payer: Self-pay

## 2019-02-19 DIAGNOSIS — Z20822 Contact with and (suspected) exposure to covid-19: Secondary | ICD-10-CM

## 2019-02-21 LAB — NOVEL CORONAVIRUS, NAA: SARS-CoV-2, NAA: NOT DETECTED

## 2019-05-31 ENCOUNTER — Other Ambulatory Visit: Payer: Self-pay | Admitting: Orthopedic Surgery

## 2019-05-31 DIAGNOSIS — M25571 Pain in right ankle and joints of right foot: Secondary | ICD-10-CM

## 2019-06-05 ENCOUNTER — Other Ambulatory Visit: Payer: 59

## 2019-12-31 ENCOUNTER — Ambulatory Visit (INDEPENDENT_AMBULATORY_CARE_PROVIDER_SITE_OTHER): Payer: 59 | Admitting: Family Medicine

## 2019-12-31 ENCOUNTER — Other Ambulatory Visit: Payer: Self-pay

## 2019-12-31 ENCOUNTER — Encounter: Payer: Self-pay | Admitting: Family Medicine

## 2019-12-31 VITALS — BP 160/90 | HR 78 | Temp 97.9°F | Ht 75.0 in | Wt 209.3 lb

## 2019-12-31 DIAGNOSIS — R42 Dizziness and giddiness: Secondary | ICD-10-CM | POA: Diagnosis not present

## 2019-12-31 DIAGNOSIS — I10 Essential (primary) hypertension: Secondary | ICD-10-CM | POA: Diagnosis not present

## 2019-12-31 MED ORDER — AMLODIPINE BESYLATE 5 MG PO TABS: 5.0000 mg | ORAL_TABLET | Freq: Every day | ORAL | 6 refills | Status: DC

## 2019-12-31 NOTE — Assessment & Plan Note (Signed)
BP again elevated. Reviewed healthy diet and lifestyle choices to help control blood pressure, see handout. Start amlodipine 5mg  daily - watch for pedal edema side effect. Reassess at CPE in 1 month. Labs at that time. EKG from 2019 reviewed.

## 2019-12-31 NOTE — Patient Instructions (Addendum)
Blood pressures are staying too high.  Start amlodipine 5mg  daily.  Continue watching BP at home.  Your goal blood pressure is <140/90, ideally lower (120/80). Work on low salt/sodium diet - goal <2gm (2,000mg ) per day. Eat a diet high in fruits/vegetables and whole grains.  Look into mediterranean and DASH diet.  Goal activity is 171min/wk of moderate intensity exercise.  This can be split into 30 minute chunks.  If you are not at this level, you can start with smaller 10-15 min increments and slowly build up activity. Look at www.heart.org for more resources   DASH Eating Plan DASH stands for "Dietary Approaches to Stop Hypertension." The DASH eating plan is a healthy eating plan that has been shown to reduce high blood pressure (hypertension). It may also reduce your risk for type 2 diabetes, heart disease, and stroke. The DASH eating plan may also help with weight loss. What are tips for following this plan?  General guidelines  Avoid eating more than 2,300 mg (milligrams) of salt (sodium) a day. If you have hypertension, you may need to reduce your sodium intake to 1,500 mg a day.  Limit alcohol intake to no more than 1 drink a day for nonpregnant women and 2 drinks a day for men. One drink equals 12 oz of beer, 5 oz of wine, or 1 oz of hard liquor.  Work with your health care provider to maintain a healthy body weight or to lose weight. Ask what an ideal weight is for you.  Get at least 30 minutes of exercise that causes your heart to beat faster (aerobic exercise) most days of the week. Activities may include walking, swimming, or biking.  Work with your health care provider or diet and nutrition specialist (dietitian) to adjust your eating plan to your individual calorie needs. Reading food labels   Check food labels for the amount of sodium per serving. Choose foods with less than 5 percent of the Daily Value of sodium. Generally, foods with less than 300 mg of sodium per serving  fit into this eating plan.  To find whole grains, look for the word "whole" as the first word in the ingredient list. Shopping  Buy products labeled as "low-sodium" or "no salt added."  Buy fresh foods. Avoid canned foods and premade or frozen meals. Cooking  Avoid adding salt when cooking. Use salt-free seasonings or herbs instead of table salt or sea salt. Check with your health care provider or pharmacist before using salt substitutes.  Do not fry foods. Cook foods using healthy methods such as baking, boiling, grilling, and broiling instead.  Cook with heart-healthy oils, such as olive, canola, soybean, or sunflower oil. Meal planning  Eat a balanced diet that includes: ? 5 or more servings of fruits and vegetables each day. At each meal, try to fill half of your plate with fruits and vegetables. ? Up to 6-8 servings of whole grains each day. ? Less than 6 oz of lean meat, poultry, or fish each day. A 3-oz serving of meat is about the same size as a deck of cards. One egg equals 1 oz. ? 2 servings of low-fat dairy each day. ? A serving of nuts, seeds, or beans 5 times each week. ? Heart-healthy fats. Healthy fats called Omega-3 fatty acids are found in foods such as flaxseeds and coldwater fish, like sardines, salmon, and mackerel.  Limit how much you eat of the following: ? Canned or prepackaged foods. ? Food that is high in  trans fat, such as fried foods. ? Food that is high in saturated fat, such as fatty meat. ? Sweets, desserts, sugary drinks, and other foods with added sugar. ? Full-fat dairy products.  Do not salt foods before eating.  Try to eat at least 2 vegetarian meals each week.  Eat more home-cooked food and less restaurant, buffet, and fast food.  When eating at a restaurant, ask that your food be prepared with less salt or no salt, if possible. What foods are recommended? The items listed may not be a complete list. Talk with your dietitian about what  dietary choices are best for you. Grains Whole-grain or whole-wheat bread. Whole-grain or whole-wheat pasta. Brown rice. [place]. Bulgur. Whole-grain and low-sodium cereals. Pita bread. Low-fat, low-sodium crackers. Whole-wheat flour tortillas. Vegetables Fresh or frozen vegetables (raw, steamed, roasted, or grilled). Low-sodium or reduced-sodium tomato and vegetable juice. Low-sodium or reduced-sodium tomato sauce and tomato paste. Low-sodium or reduced-sodium canned vegetables. Fruits All fresh, dried, or frozen fruit. Canned fruit in natural juice (without added sugar). Meat and other protein foods Skinless chicken or [place]. Ground chicken or [place]. Pork with fat trimmed off. Fish and seafood. Egg whites. Dried beans, peas, or lentils. Unsalted nuts, nut butters, and seeds. Unsalted canned beans. Lean cuts of beef with fat trimmed off. Low-sodium, lean deli meat. Dairy Low-fat (1%) or fat-free (skim) milk. Fat-free, low-fat, or reduced-fat cheeses. Nonfat, low-sodium ricotta or cottage cheese. Low-fat or nonfat yogurt. Low-fat, low-sodium cheese. Fats and oils Soft margarine without trans fats. Vegetable oil. Low-fat, reduced-fat, or light mayonnaise and salad dressings (reduced-sodium). Canola, safflower, olive, soybean, and sunflower oils. Avocado. Seasoning and other foods Herbs. Spices. Seasoning mixes without salt. Unsalted popcorn and pretzels. Fat-free sweets. What foods are not recommended? The items listed may not be a complete list. Talk with your dietitian about what dietary choices are best for you. Grains Baked goods made with fat, such as croissants, muffins, or some breads. Dry pasta or rice meal packs. Vegetables Creamed or fried vegetables. Vegetables in a cheese sauce. Regular canned vegetables (not low-sodium or reduced-sodium). Regular canned tomato sauce and paste (not low-sodium or reduced-sodium). Regular tomato and vegetable juice (not low-sodium or  reduced-sodium). Angie Fava. Olives. Fruits Canned fruit in a light or heavy syrup. Fried fruit. Fruit in cream or butter sauce. Meat and other protein foods Fatty cuts of meat. Ribs. Fried meat. Berniece Salines. Sausage. Bologna and other processed lunch meats. Salami. Fatback. Hotdogs. Bratwurst. Salted nuts and seeds. Canned beans with added salt. Canned or smoked fish. Whole eggs or egg yolks. Chicken or [place] with skin. Dairy Whole or 2% milk, cream, and half-and-half. Whole or full-fat cream cheese. Whole-fat or sweetened yogurt. Full-fat cheese. Nondairy creamers. Whipped toppings. Processed cheese and cheese spreads. Fats and oils Butter. Stick margarine. Lard. Shortening. Ghee. Bacon fat. Tropical oils, such as coconut, palm kernel, or palm oil. Seasoning and other foods Salted popcorn and pretzels. Onion salt, garlic salt, seasoned salt, table salt, and sea salt. Worcestershire sauce. Tartar sauce. Barbecue sauce. Teriyaki sauce. Soy sauce, including reduced-sodium. Steak sauce. Canned and packaged gravies. Fish sauce. Oyster sauce. Cocktail sauce. Horseradish that you find on the shelf. Ketchup. Mustard. Meat flavorings and tenderizers. Bouillon cubes. Hot sauce and Tabasco sauce. Premade or packaged marinades. Premade or packaged taco seasonings. Relishes. Regular salad dressings. Where to find more information:  National Heart, Lung, and [place]: https://wilson-eaton.com/  American Heart Association: www.heart.org Summary  The DASH eating plan is a healthy eating plan that  has been shown to reduce high blood pressure (hypertension). It may also reduce your risk for type 2 diabetes, heart disease, and stroke.  With the DASH eating plan, you should limit salt (sodium) intake to 2,300 mg a day. If you have hypertension, you may need to reduce your sodium intake to 1,500 mg a day.  When on the DASH eating plan, aim to eat more fresh fruits and vegetables, whole grains, lean proteins, low-fat  dairy, and heart-healthy fats.  Work with your health care provider or diet and nutrition specialist (dietitian) to adjust your eating plan to your individual calorie needs. This information is not intended to replace advice given to you by your health care provider. Make sure you discuss any questions you have with your health care provider. Document Revised: 05/19/2017 Document Reviewed: 05/30/2016 Elsevier Patient Education  2020 [employer].

## 2019-12-31 NOTE — Assessment & Plan Note (Signed)
Anticipate HTN related -reassess with better BP control.

## 2019-12-31 NOTE — Progress Notes (Signed)
This visit was conducted in person.  BP (!) 160/90 (BP Location: Right Arm, Cuff Size: Large)   Pulse 78   Temp 97.9 F (36.6 C) (Temporal)   Ht 6\' 3"  (1.905 m)   Wt 209 lb 5 oz (94.9 kg)   SpO2 98%   BMI 26.16 kg/m   Orthostatic VS for the past 24 hrs (Last 3 readings):  BP- Lying BP- Standing at 0 minutes  12/31/19 0820 -- 142/90  12/31/19 0818 144/80 --    BP Readings from Last 3 Encounters:  12/31/19 (!) 160/90  01/03/19 (!) 161/80  08/29/18 (!) 160/100    CC: HTN f/u visit  Subjective:    Patient ID: [id number], male    DOB: 1991/03/04, 29 y.o.   MRN: [id number]  HPI: Tony Peck is a 29 y.o. male presenting on 12/31/2019 for Hypertension (Here for f/u.  C/o recent episode of dizziness, HA and elevated BP.  Started 12/28/19. Yesterday 188/115 while at work, then 178/95 at home.  C/o dull HA today. )   2 promotions at work at 02/28/20. Now supervises all case managers.   Now living with roommate.   H/o hypertension in the past not on antihypertensives. Recently having several episodes of dizziness latest on Friday associated with headache - "felt like on a boat". Noted some blurry vision as well. Checked BP and it was high 170-180s/90-110s. Occasional dyspnea. Recently eating more pork recently. Overall eats healthy otherwise. Limits caffeine <1 cup/day.  No CP/tightness, leg swelling.  Activity - not as active, not recently going to gym, about once a week. Previously ran 2 miles without trouble.  No smoking. Cut down on alcohol. No rec drugs.   He was previously on antihypertensives but this actually improved with healthy diet and exercise. Previous metoprolol caused fatigue.   + strong fmhx hypertension.  No fmhx brain aneurysm.  Has previously seen cardiology. Prior EKG normal latest 2019.   R ankle fracture 2020 - [place]. Has plate and 8 screws to R ankle.      Relevant past medical, surgical, family and social history  reviewed and updated as indicated. Interim medical history since our last visit reviewed. Allergies and medications reviewed and updated. Outpatient Medications Prior to Visit  Medication Sig Dispense Refill  . cetirizine (ZYRTEC) 10 MG tablet Take 10 mg by mouth daily.    2021 aspirin EC 325 MG tablet Take 1 tablet (325 mg total) by mouth daily. 30 tablet 0  . docusate sodium (COLACE) 100 MG capsule Take 100 mg by mouth 3 (three) times daily as needed for mild constipation.    . ondansetron (ZOFRAN) 4 MG tablet Take 1 tablet (4 mg total) by mouth every 8 (eight) hours as needed for nausea or vomiting. 30 tablet 0  . oxyCODONE (OXY IR/ROXICODONE) 5 MG immediate release tablet Take 1 tablet (5 mg total) by mouth every 4 (four) hours as needed. 40 tablet 0   No facility-administered medications prior to visit.     Per HPI unless specifically indicated in ROS section below Review of Systems Objective:  BP (!) 160/90 (BP Location: Right Arm, Cuff Size: Large)   Pulse 78   Temp 97.9 F (36.6 C) (Temporal)   Ht 6\' 3"  (1.905 m)   Wt 209 lb 5 oz (94.9 kg)   SpO2 98%   BMI 26.16 kg/m   Wt Readings from Last 3 Encounters:  12/31/19 209 lb 5 oz (94.9  kg)  01/02/19 195 lb (88.5 kg)  12/13/17 202 lb 8 oz (91.9 kg)      Physical Exam Vitals and nursing note reviewed.  Constitutional:      Appearance: Normal appearance. He is not ill-appearing.  Eyes:     Extraocular Movements: Extraocular movements intact.     Conjunctiva/sclera: Conjunctivae normal.     Pupils: Pupils are equal, round, and reactive to light.  Neck:     Thyroid: No thyroid mass, thyromegaly or thyroid tenderness.  Cardiovascular:     Rate and Rhythm: Normal rate and regular rhythm.     Pulses: Normal pulses.     Heart sounds: Normal heart sounds. No murmur heard.   Pulmonary:     Effort: Pulmonary effort is normal. No respiratory distress.     Breath sounds: Normal breath sounds. No wheezing, rhonchi or rales.    Musculoskeletal:     Cervical back: Normal range of motion and neck supple.     Right lower leg: No edema.     Left lower leg: No edema.  Neurological:     Mental Status: He is alert.  Psychiatric:        Mood and Affect: Mood normal.        Behavior: Behavior normal.        Thought Content: Thought content normal.       Results for orders placed or performed in visit on 02/19/19  Novel Coronavirus, NAA ([facility])   Specimen: Oropharyngeal(OP) collection in vial transport medium   OROPHARYNGEA  TESTING  Result Value Ref Range   SARS-CoV-2, NAA Not Detected Not Detected   Assessment & Plan:  This visit occurred during the SARS-CoV-2 public health emergency.  Safety protocols were in place, including screening questions prior to the visit, additional usage of staff PPE, and extensive cleaning of exam room while observing appropriate contact time as indicated for disinfecting solutions.   Problem List Items Addressed This Visit    Hypertension    BP again elevated. Reviewed healthy diet and lifestyle choices to help control blood pressure, see handout. Start amlodipine 5mg  daily - watch for pedal edema side effect. Reassess at CPE in 1 month. Labs at that time. EKG from 2019 reviewed.       Relevant Medications   amLODipine (NORVASC) 5 MG tablet   Dizziness    Anticipate HTN related -reassess with better BP control.           Meds ordered this encounter  Medications  . amLODipine (NORVASC) 5 MG tablet    Sig: Take 1 tablet (5 mg total) by mouth daily.    Dispense:  30 tablet    Refill:  6   No orders of the defined types were placed in this encounter.   Patient instructions: Blood pressures are staying too high.  Start amlodipine 5mg  daily.  Continue watching BP at home.  Your goal blood pressure is <140/90, ideally lower (120/80). Work on low salt/sodium diet - goal <2gm (2,000mg ) per day. Eat a diet high in fruits/vegetables and whole grains.  Look into  mediterranean and DASH diet.  Goal activity is 111min/wk of moderate intensity exercise.  This can be split into 30 minute chunks.  If you are not at this level, you can start with smaller 10-15 min increments and slowly build up activity. Look at www.heart.org for more resources   Follow up plan: Return if symptoms worsen or fail to improve.  , MD

## 2020-01-19 ENCOUNTER — Other Ambulatory Visit: Payer: Self-pay | Admitting: Family Medicine

## 2020-01-19 DIAGNOSIS — I10 Essential (primary) hypertension: Secondary | ICD-10-CM

## 2020-01-19 DIAGNOSIS — Z13220 Encounter for screening for lipoid disorders: Secondary | ICD-10-CM

## 2020-01-21 ENCOUNTER — Other Ambulatory Visit: Payer: Self-pay

## 2020-01-21 ENCOUNTER — Other Ambulatory Visit (INDEPENDENT_AMBULATORY_CARE_PROVIDER_SITE_OTHER): Payer: 59

## 2020-01-21 DIAGNOSIS — Z13220 Encounter for screening for lipoid disorders: Secondary | ICD-10-CM | POA: Diagnosis not present

## 2020-01-21 DIAGNOSIS — I10 Essential (primary) hypertension: Secondary | ICD-10-CM

## 2020-01-21 LAB — COMPREHENSIVE METABOLIC PANEL
ALT: 21 U/L (ref 0–53)
AST: 18 U/L (ref 0–37)
Albumin: 5 g/dL (ref 3.5–5.2)
Alkaline Phosphatase: 69 U/L (ref 39–117)
BUN: 13 mg/dL (ref 6–23)
CO2: 30 mEq/L (ref 19–32)
Calcium: 10.2 mg/dL (ref 8.4–10.5)
Chloride: 103 mEq/L (ref 96–112)
Creatinine, Ser: 0.98 mg/dL (ref 0.40–1.50)
GFR: 91.27 mL/min (ref >60.00)
Glucose, Bld: 99 mg/dL (ref 70–99)
Potassium: 4.3 mEq/L (ref 3.5–5.1)
Sodium: 136 mEq/L (ref 135–145)
Total Bilirubin: 1.4 mg/dL — ABNORMAL HIGH (ref 0.2–1.2)
Total Protein: 7.1 g/dL (ref 6.0–8.3)

## 2020-01-21 LAB — LIPID PANEL
Cholesterol: 182 mg/dL (ref 0–200)
HDL: 65.5 mg/dL (ref >39.00)
LDL Cholesterol: 92 mg/dL (ref 0–99)
NonHDL: 116.47
Total CHOL/HDL Ratio: 3
Triglycerides: 121 mg/dL (ref 0.0–149.0)
VLDL: 24.2 mg/dL (ref 0.0–40.0)

## 2020-01-21 LAB — TSH: TSH: 3.09 u[IU]/mL (ref 0.35–4.50)

## 2020-01-23 ENCOUNTER — Telehealth: Payer: Self-pay | Admitting: Internal Medicine

## 2020-01-23 ENCOUNTER — Other Ambulatory Visit: Payer: Self-pay

## 2020-01-23 ENCOUNTER — Encounter: Payer: Self-pay | Admitting: Family Medicine

## 2020-01-23 ENCOUNTER — Ambulatory Visit (INDEPENDENT_AMBULATORY_CARE_PROVIDER_SITE_OTHER): Payer: 59 | Admitting: Family Medicine

## 2020-01-23 VITALS — BP 150/98 | HR 92 | Temp 98.0°F | Ht 73.5 in | Wt 203.4 lb

## 2020-01-23 DIAGNOSIS — Z113 Encounter for screening for infections with a predominantly sexual mode of transmission: Secondary | ICD-10-CM | POA: Diagnosis not present

## 2020-01-23 DIAGNOSIS — R9431 Abnormal electrocardiogram [ECG] [EKG]: Secondary | ICD-10-CM | POA: Diagnosis not present

## 2020-01-23 DIAGNOSIS — Z0001 Encounter for general adult medical examination with abnormal findings: Secondary | ICD-10-CM

## 2020-01-23 DIAGNOSIS — Z23 Encounter for immunization: Secondary | ICD-10-CM | POA: Diagnosis not present

## 2020-01-23 DIAGNOSIS — I10 Essential (primary) hypertension: Secondary | ICD-10-CM | POA: Diagnosis not present

## 2020-01-23 DIAGNOSIS — R55 Syncope and collapse: Secondary | ICD-10-CM

## 2020-01-23 MED ORDER — AMLODIPINE BESYLATE 10 MG PO TABS: 10.0000 mg | ORAL_TABLET | Freq: Every day | ORAL | 6 refills | Status: DC

## 2020-01-23 NOTE — Telephone Encounter (Signed)
On review aldosterone + renin urine collection was resulted and the patient was made aware of the results. The echo results are not available. The message has been sent to our practice administrator [place] to look in to.

## 2020-01-23 NOTE — Patient Instructions (Addendum)
Tdap (tetanus and whooping cough booster) today  Increase amlodipine to 10mg  daily. Keep track of blood pressures at home. Let me know in 2 weeks if consistently >140/90 to add second blood pressure medicine.  EKG today  Schedule eye exam.  Return in 1 month for blood pressure follow up visit.   Health Maintenance, Male Adopting a healthy lifestyle and getting preventive care are important in promoting health and wellness. Ask your health care provider about:  The right schedule for you to have regular tests and exams.  Things you can do on your own to prevent diseases and keep yourself healthy. What should I know about diet, weight, and exercise? Eat a healthy diet   Eat a diet that includes plenty of vegetables, fruits, low-fat dairy products, and lean protein.  Do not eat a lot of foods that are high in solid fats, added sugars, or sodium. Maintain a healthy weight Body mass index (BMI) is a measurement that can be used to identify possible weight problems. It estimates body fat based on height and weight. Your health care provider can help determine your BMI and help you achieve or maintain a healthy weight. Get regular exercise Get regular exercise. This is one of the most important things you can do for your health. Most adults should:  Exercise for at least 150 minutes each week. The exercise should increase your heart rate and make you sweat (moderate-intensity exercise).  Do strengthening exercises at least twice a week. This is in addition to the moderate-intensity exercise.  Spend less time sitting. Even light physical activity can be beneficial. Watch cholesterol and blood lipids Have your blood tested for lipids and cholesterol at 28 years of age, then have this test every 5 years. You may need to have your cholesterol levels checked more often if:  Your lipid or cholesterol levels are high.  You are older than 28 years of age.  You are at high risk for heart  disease. What should I know about cancer screening? Many types of cancers can be detected early and may often be prevented. Depending on your health history and family history, you may need to have cancer screening at various ages. This may include screening for:  Colorectal cancer.  Prostate cancer.  Skin cancer.  Lung cancer. What should I know about heart disease, diabetes, and high blood pressure? Blood pressure and heart disease  High blood pressure causes heart disease and increases the risk of stroke. This is more likely to develop in people who have high blood pressure readings, are of African descent, or are overweight.  Talk with your health care provider about your target blood pressure readings.  Have your blood pressure checked: ? Every 3-5 years if you are 20-18 years of age. ? Every year if you are 15 years old or older.  If you are between the ages of 33 and 46 and are a current or former smoker, ask your health care provider if you should have a one-time screening for abdominal aortic aneurysm (AAA). Diabetes Have regular diabetes screenings. This checks your fasting blood sugar level. Have the screening done:  Once every three years after age 68 if you are at a normal weight and have a low risk for diabetes.  More often and at a younger age if you are overweight or have a high risk for diabetes. What should I know about preventing infection? Hepatitis B If you have a higher risk for hepatitis B, you should be screened  for this virus. Talk with your health care provider to find out if you are at risk for hepatitis B infection. Hepatitis C Blood testing is recommended for:  Everyone born from [birth year] through 1964.  Anyone with known risk factors for hepatitis C. Sexually transmitted infections (STIs)  You should be screened each year for STIs, including gonorrhea and chlamydia, if: ? You are sexually active and are younger than 28 years of age. ? You are older  than 28 years of age and your health care provider tells you that you are at risk for this type of infection. ? Your sexual activity has changed since you were last screened, and you are at increased risk for chlamydia or gonorrhea. Ask your health care provider if you are at risk.  Ask your health care provider about whether you are at high risk for HIV. Your health care provider may recommend a prescription medicine to help prevent HIV infection. If you choose to take medicine to prevent HIV, you should first get tested for HIV. You should then be tested every 3 months for as long as you are taking the medicine. Follow these instructions at home: Lifestyle  Do not use any products that contain nicotine or tobacco, such as cigarettes, e-cigarettes, and chewing tobacco. If you need help quitting, ask your health care provider.  Do not use street drugs.  Do not share needles.  Ask your health care provider for help if you need support or information about quitting drugs. Alcohol use  Do not drink alcohol if your health care provider tells you not to drink.  If you drink alcohol: ? Limit how much you have to 0-2 drinks a day. ? Be aware of how much alcohol is in your drink. In the [place] one drink equals one 12 oz bottle of beer (355 mL), one 5 oz glass of wine (148 mL), or one 1 oz glass of hard liquor (44 mL). General instructions  Schedule regular health, dental, and eye exams.  Stay current with your vaccines.  Tell your health care provider if: ? You often feel depressed. ? You have ever been abused or do not feel safe at home. Summary  Adopting a healthy lifestyle and getting preventive care are important in promoting health and wellness.  Follow your health care provider's instructions about healthy diet, exercising, and getting tested or screened for diseases.  Follow your health care provider's instructions on monitoring your cholesterol and blood pressure. This  information is not intended to replace advice given to you by your health care provider. Make sure you discuss any questions you have with your health care provider. Document Revised: 05/30/2018 Document Reviewed: 05/30/2018 Elsevier Patient Education  2020 [employer].

## 2020-01-23 NOTE — Telephone Encounter (Signed)
[facility] calling  Dr Sharen Hones is unable to see results from ECHO and Renal testing done on 12/20/2017, as well as a 24 hour urine culture  States it may not have been read, please review  Message has also been sent to [place] to review

## 2020-01-23 NOTE — Assessment & Plan Note (Signed)
Update EKG today.  

## 2020-01-23 NOTE — Assessment & Plan Note (Addendum)
Preventative protocols reviewed and updated unless pt declined. Discussed healthy diet and lifestyle.  Discussed PHQ9/GAD7 and likely stress related anxiety. STD screen today

## 2020-01-23 NOTE — Assessment & Plan Note (Signed)
Isolated episode 10/2019. In h/o vasovagal syncope in the past, has had previous reassuring tilt table test and other workup including holter monitor and echocardiogram. Update if recurrent to proceed with further evaluation.

## 2020-01-23 NOTE — Assessment & Plan Note (Addendum)
Persistently elevated despite amlodipine 5mg  - will increase to 10mg  dose. New dose sent in. He will continue monitoring BP at home and let me know BP readings in 2 wks to add second medication if needed (likely benazepril). RTC 1 mo f/u visit. Update EKG today.   Reviewed chart - saw Dr End 2019 with planned workup for secondary hypertension - normal aldosterone at that time, reassuring hotler and echo. I don't see where 24 hour urine was ever resulted although patient does remember collecting urine and turning this in. I also can't see actual results of renal doppler - it seems to be mapped to the echocardiogram results - will check with my referral coordinator regarding this.

## 2020-01-23 NOTE — Progress Notes (Signed)
This visit was conducted in person.  BP (!) 150/98 (BP Location: Right Arm, Patient Position: Sitting, Cuff Size: Large)   Pulse 92   Temp 98 F (36.7 C) (Temporal)   Ht 6' 1.5" (1.867 m)   Wt 203 lb 6 oz (92.3 kg)   SpO2 98%   BMI 26.47 kg/m   BP Readings from Last 3 Encounters:  01/23/20 (!) 150/98  12/31/19 (!) 160/90  01/03/19 (!) 161/80  Elevated on retesting CC: CPE Subjective:    Patient ID: Tony Peck, male    DOB: 1991-05-21, 29 y.o.   MRN: [id number]  HPI: [name] is a 29 y.o. male presenting on 01/23/2020 for Annual Exam   BP remaining elevated at home 150s/90s despite amlodipine 5mg  daily.  Syncopal episode 11/12/2019.   Preventative:  Flu shot yearly COVID vaccine - Pfizer 06/2019 x2 Tetanus - update today  Peck belt use discussed. No texting and driving Sunscreen use discussed. No changing moles on skin.  Non smoker  Alcohol - socially  Dentist - due  Eye exam yearly  Not currently sexually active in the past year. 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 Interim medical history since our last visit reviewed. Allergies and medications reviewed and updated. Outpatient Medications Prior to Visit  Medication Sig Dispense Refill  . cetirizine (ZYRTEC) 10 MG tablet Take 10 mg by mouth daily.    [employer] amLODipine (NORVASC) 5 MG tablet Take 1 tablet (5 mg total) by mouth daily. 30 tablet 6   No facility-administered medications prior to visit.     Per HPI unless specifically indicated in ROS section below Review of Systems  Constitutional: Negative for activity change, appetite change, chills, fatigue, fever and  unexpected weight change.  HENT: Negative for hearing loss.   Eyes: Negative for visual disturbance.  Respiratory: Negative for cough, chest tightness, shortness of breath and wheezing.   Cardiovascular: Negative for chest pain, palpitations and leg swelling.  Gastrointestinal: Positive for diarrhea (loose stools) and nausea (in am - ?anxiety related). Negative for abdominal distention, abdominal pain, blood in stool, constipation and vomiting.  Genitourinary: Negative for difficulty urinating and hematuria.  Musculoskeletal: Negative for arthralgias, myalgias and neck pain.  Skin: Negative for rash.  Neurological: Positive for dizziness, syncope (10/2019 x1) and headaches. Negative for seizures.  Hematological: Negative for adenopathy. Does not bruise/bleed easily.  Psychiatric/Behavioral: Positive for dysphoric mood. The patient is nervous/anxious.    Objective:  BP (!) 150/98 (BP Location: Right Arm, Patient Position: Sitting, Cuff Size: Large)   Pulse 92   Temp 98 F (36.7 C) (Temporal)   Ht 6' 1.5" (1.867 m)   Wt 203 lb 6 oz (92.3 kg)   SpO2 98%   BMI 26.47 kg/m   Wt Readings from Last 3 Encounters:  01/23/20 203 lb 6 oz (92.3 kg)  12/31/19 209 lb 5 oz (94.9 kg)  01/02/19 195 lb (88.5 kg)      Physical Exam Vitals and nursing note reviewed.  Constitutional:      General: He is not in acute distress.    Appearance: Normal appearance. He is well-developed. He is not ill-appearing.  HENT:  Head: Normocephalic and atraumatic.     Right Ear: Hearing, tympanic membrane, ear canal and external ear normal.     Left Ear: Hearing, tympanic membrane, ear canal and external ear normal.  Eyes:     General: No scleral icterus.    Extraocular Movements: Extraocular movements intact.     Conjunctiva/sclera: Conjunctivae normal.     Pupils: Pupils are equal, round, and reactive to light.  Cardiovascular:     Rate and Rhythm: Normal rate and regular rhythm.     Pulses: Normal  pulses.          Radial pulses are 2+ on the right side and 2+ on the left side.     Heart sounds: Normal heart sounds. No murmur heard.   Pulmonary:     Effort: Pulmonary effort is normal. No respiratory distress.     Breath sounds: Normal breath sounds. No wheezing, rhonchi or rales.  Abdominal:     General: Abdomen is flat. Bowel sounds are normal. There is no distension.     Palpations: Abdomen is soft. There is no mass.     Tenderness: There is no abdominal tenderness. There is no guarding or rebound.     Hernia: No hernia is present.  Musculoskeletal:        General: Normal range of motion.     Cervical back: Normal range of motion and neck supple.     Right lower leg: No edema.     Left lower leg: No edema.  Lymphadenopathy:     Cervical: No cervical adenopathy.  Skin:    General: Skin is warm and dry.     Findings: No rash.  Neurological:     General: No focal deficit present.     Mental Status: He is alert and oriented to person, place, and time.     Comments: CN grossly intact, station and gait intact  Psychiatric:        Mood and Affect: Mood normal.        Behavior: Behavior normal.        Thought Content: Thought content normal.        Judgment: Judgment normal.       Results for orders placed or performed in visit on 01/21/20  TSH  Result Value Ref Range   TSH 3.09 0.35 - 4.50 uIU/mL  Comprehensive metabolic panel  Result Value Ref Range   Sodium 136 135 - 145 mEq/L   Potassium 4.3 3.5 - 5.1 mEq/L   Chloride 103 96 - 112 mEq/L   CO2 30 19 - 32 mEq/L   Glucose, Bld 99 70 - 99 mg/dL   BUN 13 6 - 23 mg/dL   Creatinine, Ser 0.53 0.40 - 1.50 mg/dL   Total Bilirubin 1.4 (H) 0.2 - 1.2 mg/dL   Alkaline Phosphatase 69 39 - 117 U/L   AST 18 0 - 37 U/L   ALT 21 0 - 53 U/L   Total Protein 7.1 6.0 - 8.3 g/dL   Albumin 5.0 3.5 - 5.2 g/dL   GFR 97.67 >34.19 mL/min   Calcium 10.2 8.4 - 10.5 mg/dL  Lipid panel  Result Value Ref Range   Cholesterol 182 0 - 200  mg/dL   Triglycerides 379.0 0 - 149 mg/dL   HDL 24.09 >73.53 mg/dL   VLDL 29.9 0.0 - 24.2 mg/dL   LDL Cholesterol 92 0 - 99 mg/dL   Total CHOL/HDL Ratio 3    NonHDL 116.47    Depression screen PHQ  2/9 01/23/2020  Decreased Interest 0  Down, Depressed, Hopeless 1  PHQ - 2 Score 1  Altered sleeping 2  Tired, decreased energy 2  Change in appetite 2  Feeling bad or failure about yourself  0  Trouble concentrating 2  Moving slowly or fidgety/restless 1  Suicidal thoughts 0  PHQ-9 Score 10   GAD 7 : Generalized Anxiety Score 01/23/2020  Nervous, Anxious, on Edge 2  Control/stop worrying 1  Worry too much - different things 2  Trouble relaxing 2  Restless 0  Easily annoyed or irritable 1  Afraid - awful might happen 0  Total GAD 7 Score 8   EKG - sinus bradycardia 50s, normal axis, intervals, no acute ST/T changes.  Assessment & Plan:  This visit occurred during the SARS-CoV-2 public health emergency.  Safety protocols were in place, including screening questions prior to the visit, additional usage of staff PPE, and extensive cleaning of exam room while observing appropriate contact time as indicated for disinfecting solutions.   Problem List Items Addressed This Visit    Syncope    Isolated episode 10/2019. In h/o vasovagal syncope in the past, has had previous reassuring tilt table test and other workup including holter monitor and echocardiogram. Update if recurrent to proceed with further evaluation.       Relevant Medications   amLODipine (NORVASC) 10 MG tablet   Shortened PR interval    Update EKG today.       Hypertension    Persistently elevated despite amlodipine 5mg  - will increase to 10mg  dose. New dose sent in. He will continue monitoring BP at home and let me know BP readings in 2 wks to add second medication if needed (likely benazepril). RTC 1 mo f/u visit. Update EKG today.   Reviewed chart - saw Dr End 2019 with planned workup for secondary hypertension -  normal aldosterone at that time, reassuring hotler and echo. I don't see where 24 hour urine was ever resulted although patient does remember collecting urine and turning this in. I also can't see actual results of renal doppler - it seems to be mapped to the echocardiogram results - will check with my referral coordinator regarding this.       Relevant Medications   amLODipine (NORVASC) 10 MG tablet   Other Relevant Orders   EKG 12-Lead (Completed)   Catecholamines, fractionated, urine, 24 hour   Metanephrines, urine, 24 hour   Encounter for general adult medical examination with abnormal findings - Primary    Preventative protocols reviewed and updated unless pt declined. Discussed healthy diet and lifestyle.  Discussed PHQ9/GAD7 and likely stress related anxiety. STD screen today       Other Visit Diagnoses    Screen for STD (sexually transmitted disease)       Relevant Orders   HIV Antibody (routine testing w rflx)   RPR   C. trachomatis/N. gonorrhoeae RNA   Need for Tdap vaccination       Relevant Orders   Tdap vaccine greater than or equal to 7yo IM (Completed)       Meds ordered this encounter  Medications  . amLODipine (NORVASC) 10 MG tablet    Sig: Take 1 tablet (10 mg total) by mouth daily.    Dispense:  30 tablet    Refill:  6   Orders Placed This Encounter  Procedures  . C. trachomatis/N. gonorrhoeae RNA  . Tdap vaccine greater than or equal to 7yo IM  . HIV Antibody (  routine testing w rflx)  . RPR  . Catecholamines, fractionated, urine, 24 hour    Standing Status:   Future    Standing Expiration Date:   01/22/2021  . Metanephrines, urine, 24 hour    Standing Status:   Future    Standing Expiration Date:   01/22/2021  . EKG 12-Lead    Patient instructions: Tdap (tetanus and whooping cough booster) today  Increase amlodipine to 10mg  daily. Keep track of blood pressures at home. Let me know in 2 weeks if consistently >140/90 to add second blood pressure  medicine.  EKG today  Schedule eye exam.  Return in 1 month for blood pressure follow up visit.   Follow up plan: Return in about 4 weeks (around 02/20/2020) for follow up visit.  Eustaquio BoydenJavier Patrik Turnbaugh, MD

## 2020-01-24 LAB — HIV ANTIBODY (ROUTINE TESTING W REFLEX): HIV 1&2 Ab, 4th Generation: NONREACTIVE

## 2020-01-24 LAB — C. TRACHOMATIS/N. GONORRHOEAE RNA
C. trachomatis RNA, TMA: NOT DETECTED
N. gonorrhoeae RNA, TMA: NOT DETECTED

## 2020-01-24 LAB — RPR: RPR Ser Ql: NONREACTIVE

## 2020-01-27 ENCOUNTER — Encounter (HOSPITAL_COMMUNITY): Payer: Self-pay

## 2020-01-27 ENCOUNTER — Other Ambulatory Visit (HOSPITAL_COMMUNITY): Payer: Self-pay | Admitting: Internal Medicine

## 2020-01-31 ENCOUNTER — Other Ambulatory Visit: Payer: 59

## 2020-01-31 DIAGNOSIS — I10 Essential (primary) hypertension: Secondary | ICD-10-CM

## 2020-01-31 NOTE — Telephone Encounter (Signed)
Just wanted to follow up on this for patient.

## 2020-02-06 LAB — CATECHOLAMINES, FRACTIONATED, URINE, 24 HOUR
Calc Total (E+NE): 69 mcg/24 h (ref 26–121)
Creatinine, Urine mg/day-CATEUR: 2.03 g/(24.h) (ref 0.50–2.15)
Dopamine 24 Hr Urine: 352 mcg/24 h (ref 52–480)
Epinephrine, 24H, Ur: 18 mcg/24 h (ref 2–24)
Norepinephrine, 24H, Ur: 51 mcg/24 h (ref 15–100)
Total Volume: 1650 mL

## 2020-02-06 LAB — METANEPHRINES, URINE, 24 HOUR
Metaneph Total, Ur: 426 mcg/24 h (ref 94–604)
Metanephrines, Ur: 173 mcg/24 h (ref 25–222)
Normetanephrine, 24H Ur: 253 mcg/24 h (ref 40–412)
Volume, Urine-VMAUR: 1650 mL

## 2020-02-21 ENCOUNTER — Telehealth: Payer: Self-pay | Admitting: *Deleted

## 2020-02-21 NOTE — Telephone Encounter (Signed)
Patient called stating that he just found out that his roommate was exposed to covid Tuesday. Patient stated that his roommate was in the car all day with the person that just tested positive for covid. Patient stated that he shares food with his roommate and they drank after each other. Patient stated that all of the people involved have been vaccinated,but it was several months ago. Patient stated that he does have a headache today but no other symptoms. Patient wants to know if he should be tested and quarantine. Patient was advised that he should be tested and quarantine until his results are back. Patient was advised that he should go ahead and get tested today since he has a headache. Patient was given information on places performing covid testing. Patient stated that he is leaving work now and will work on getting a test done today. Patient was given ER precautions and he verbalized understanding. Patient was advised to quarantine, rest, drink plenty of fluids and eat well balanced meals. Patient was advised to let Dr. Sharen Hones know his test results when he gets them back.

## 2020-02-21 NOTE — Telephone Encounter (Signed)
Noted. Agree. Thank you.

## 2020-02-26 ENCOUNTER — Other Ambulatory Visit: Payer: Self-pay

## 2020-02-26 ENCOUNTER — Ambulatory Visit: Payer: 59 | Admitting: Family Medicine

## 2020-02-26 ENCOUNTER — Encounter: Payer: Self-pay | Admitting: Family Medicine

## 2020-02-26 VITALS — BP 130/70 | HR 70 | Temp 97.9°F | Ht 73.5 in | Wt 202.2 lb

## 2020-02-26 DIAGNOSIS — Z23 Encounter for immunization: Secondary | ICD-10-CM | POA: Diagnosis not present

## 2020-02-26 DIAGNOSIS — I10 Essential (primary) hypertension: Secondary | ICD-10-CM

## 2020-02-26 MED ORDER — AMLODIPINE BESY-BENAZEPRIL HCL 5-10 MG PO CAPS: 1.0000 | ORAL_CAPSULE | Freq: Every day | ORAL | 6 refills | Status: DC

## 2020-02-26 NOTE — Patient Instructions (Addendum)
Flu shot today Blood pressures are running better, however due to ankle swelling let's change medicine to lotrel (amlodipine 5mg , benazepril 10mg ). Watch for dry cough side effect and tongue/lip swelling allergy to benazepril . Return in 7-10 days after starting new medicine for labs.  Keep me updated via mychart with blood pressure readings.

## 2020-02-26 NOTE — Progress Notes (Signed)
This visit was conducted in person.  BP 130/70 (BP Location: Left Arm, Patient Position: Sitting, Cuff Size: Normal)   Pulse 70   Temp 97.9 F (36.6 C) (Temporal)   Ht 6' 1.5" (1.867 m)   Wt 202 lb 3 oz (91.7 kg)   SpO2 98%   BMI 26.31 kg/m    CC: HTN f/u visit  Subjective:    Patient ID: Tony Peck, male    DOB: 04/26/1991, 29 y.o.   MRN: [id number]  HPI: Tony Peck is a 29 y.o. male presenting on 02/26/2020 for Hypertension (Here for 1 mo f/u.)   HTN - Compliant with current antihypertensive regimen of amlodipine 10mg  daily. Does check blood pressures at home: 130/70s. He's started noticing some ankle swelling more noticeable at end of day, bothersome. No low blood pressure readings or symptoms of dizziness/syncope. Denies vision changes, CP/tightness, SOB. Occasional headaches, less frequent.   Previously on diuretic - didn't like how it made him feel.   Planning to start [place].      Relevant past medical, surgical, family and social history reviewed and updated as indicated. Interim medical history since our last visit reviewed. Allergies and medications reviewed and updated. Outpatient Medications Prior to Visit  Medication Sig Dispense Refill  . cetirizine (ZYRTEC) 10 MG tablet Take 10 mg by mouth daily.    amLODipine (NORVASC) 10 MG tablet Take 1 tablet (10 mg total) by mouth daily. 30 tablet 6   No facility-administered medications prior to visit.     Per HPI unless specifically indicated in ROS section below Review of Systems Objective:  BP 130/70 (BP Location: Left Arm, Patient Position: Sitting, Cuff Size: Normal)   Pulse 70   Temp 97.9 F (36.6 C) (Temporal)   Ht 6' 1.5" (1.867 m)   Wt 202 lb 3 oz (91.7 kg)   SpO2 98%   BMI 26.31 kg/m   Wt Readings from Last 3 Encounters:  02/26/20 202 lb 3 oz (91.7 kg)  01/23/20 203 lb 6 oz (92.3 kg)  12/31/19 209 lb 5 oz (94.9 kg)      Physical Exam Vitals and nursing note reviewed.  Constitutional:       Appearance: Normal appearance. He is not ill-appearing.  Cardiovascular:     Rate and Rhythm: Normal rate and regular rhythm.     Pulses: Normal pulses.     Heart sounds: Normal heart sounds. No murmur heard.   Pulmonary:     Effort: Pulmonary effort is normal. No respiratory distress.     Breath sounds: Normal breath sounds. No wheezing, rhonchi or rales.  Musculoskeletal:     Right lower leg: No edema.     Left lower leg: No edema.  Skin:    General: Skin is warm and dry.     Findings: No rash.  Neurological:     Mental Status: He is alert.  Psychiatric:        Mood and Affect: Mood normal.        Behavior: Behavior normal.       Results for orders placed or performed in visit on 01/31/20  Metanephrines, urine, 24 hour  Result Value Ref Range   Volume, Urine-VMAUR 1,650 mL   Metanephrines, Ur 173 25 - 222 mcg/24 h   Normetanephrine, 24H Ur 253 40 - 412 mcg/24 h   Metaneph Total, Ur 426 94 - 604 mcg/24 h  Catecholamines, fractionated, urine, 24 hour  Result Value Ref Range   Total Volume  1,650 mL   Epinephrine, 24H, Ur 18 2 - 24 mcg/24 h   Norepinephrine, 24H, Ur 51 15 - 100 mcg/24 h   Calc Total (E+NE) 69 26 - 121 mcg/24 h   Dopamine 24 Hr Urine 352 52 - 480 mcg/24 h   Creatinine, Urine mg/day-CATEUR 2.03 0.50 - 2.15 g/24 h   Assessment & Plan:  This visit occurred during the SARS-CoV-2 public health emergency.  Safety protocols were in place, including screening questions prior to the visit, additional usage of staff PPE, and extensive cleaning of exam room while observing appropriate contact time as indicated for disinfecting solutions.   Problem List Items Addressed This Visit    Essential hypertension - Primary    Chronic, improved control however having bothersome dependent ankle edema at end of day. Will drop amlodipine dose to 5mg  and add benazepril 10mg  daily. Reviewed watching for dry cough side effect and angioedema allergy to ACEI. RTC 10 days lab visit  for kidney check, RTC 4-6 mo HTN f/u. I did ask him to monitoring BP at home and keep me updated via mychart with BP readings. Pt agrees with plan.       Relevant Medications   amLODipine-benazepril (LOTREL) 5-10 MG capsule    Other Visit Diagnoses    Need for influenza vaccination       Relevant Orders   Flu Vaccine QUAD 36+ mos IM (Completed)       Meds ordered this encounter  Medications  . amLODipine-benazepril (LOTREL) 5-10 MG capsule    Sig: Take 1 capsule by mouth daily.    Dispense:  30 capsule    Refill:  6    Note new med   Orders Placed This Encounter  Procedures  . Flu Vaccine QUAD 36+ mos IM    Patient Instructions  Flu shot today Blood pressures are running better, however due to ankle swelling let's change medicine to lotrel (amlodipine 5mg , benazepril 10mg ). Watch for dry cough side effect and tongue/lip swelling allergy to benazepril . Return in 7-10 days after starting new medicine for labs.  Keep me updated via mychart with blood pressure readings.    Follow up plan: Return in about 4 months (around 06/27/2020) for follow up visit.  , MD

## 2020-02-26 NOTE — Assessment & Plan Note (Signed)
Chronic, improved control however having bothersome dependent ankle edema at end of day. Will drop amlodipine dose to 5mg  and add benazepril 10mg  daily. Reviewed watching for dry cough side effect and angioedema allergy to ACEI. RTC 10 days lab visit for kidney check, RTC 4-6 mo HTN f/u. I did ask him to monitoring BP at home and keep me updated via mychart with BP readings. Pt agrees with plan.

## 2020-03-27 ENCOUNTER — Telehealth (INDEPENDENT_AMBULATORY_CARE_PROVIDER_SITE_OTHER): Payer: 59 | Admitting: Family Medicine

## 2020-03-27 ENCOUNTER — Encounter: Payer: Self-pay | Admitting: Family Medicine

## 2020-03-27 ENCOUNTER — Other Ambulatory Visit: Payer: Self-pay

## 2020-03-27 ENCOUNTER — Telehealth: Payer: Self-pay

## 2020-03-27 VITALS — BP 143/78 | HR 93 | Temp 98.7°F | Ht 73.5 in | Wt 200.0 lb

## 2020-03-27 DIAGNOSIS — J029 Acute pharyngitis, unspecified: Secondary | ICD-10-CM | POA: Diagnosis not present

## 2020-03-27 NOTE — Assessment & Plan Note (Addendum)
Possible COVID vs influenza vs viral pharyngitis. Don't anticipate streptococcal pharyngitis given dry cough present and exudate/LAD absent. Discussed NSAID and tylenol use as well as further supportive care with home remedies. Update if not improving with treatment. Update with results of COVID PCR done today. Pt agrees with plan.  If COVID negative, consider coming in for RST, flu swabs.

## 2020-03-27 NOTE — Telephone Encounter (Signed)
On 03/23/20 fever, no energy and PCR covid test on 03/23/20 that was negative. Pt began to feel better mid wk and then 03/26/20 began with fever 101.4 now and S/T that is swollen and painful and H/A pain level 3, chills, bodyaches,,dry cough, sob upon exertion,diarrhea on and off this week, pt is presently at [facility] waiting for PCR test; pt said they chg $75.00 for rapid test so pt is not getting rapid test. Pt scheduled virtual video visit with DR G today at 4:30 per Oked by Dr Reece Agar. UC & ED precautions given and pt voiced understanding.I advised could take tylenol for fever but pt said he was taking tylenol earlier in the wk but thought now would see if fever would just run its course. FYI to DR G.

## 2020-03-27 NOTE — Progress Notes (Addendum)
Virtual visit completed through MyChart, a video enabled telemedicine application. Due to national recommendations of social distancing due to COVID-19, a virtual visit is felt to be most appropriate for this patient at this time. Reviewed limitations, risks, security and privacy concerns of performing a virtual visit and the availability of in person appointments. I also reviewed that there may be a patient responsible charge related to this service. The patient agreed to proceed.   Patient location: home Provider location: [employer] at [facility], office Persons participating in this virtual visit: patient, provider   If any vitals were documented, they were collected by patient at home unless specified below.    BP (!) 143/78   Pulse 93   Temp 98.7 F (37.1 C)   Ht 6' 1.5" (1.867 m)   Wt 200 lb (90.7 kg)   BMI 26.03 kg/m    CC: ST, fever, HA Subjective:    Patient ID: Tony Peck, male    DOB: [birth year], 29 y.o.   MRN: [id number]  HPI: Tony Peck is a 29 y.o. male presenting on 03/27/2020 for Fever (C/o fever- max 101.4, HA, fatigue and sore throat.  Sxs started 03/22/20. )   5d h/o fever Tmax 101.4, HA, fatigue and ST (started Thursday). Managing temp with tylenol. Body aches, dry cough, dyspnea with exertion, mild diarrhea and R sided abd discomfort. Some palpitations. Doesn't note swollen glands. No PNdrainage, rhinorrhea, nasal congestion. No ear or tooth pain. No loss of taste/smell. No urinary symptoms. No exudates in throat.   PCR COVID test negative 03/23/2020.  Has gone for testing again today - negative rapid test, sendoff pending.  He did complete Pfizer shots 06/2019.   Went to event in [place] last weekend - when he arrived home Sunday exhaustion hit in.  No known COVID exposure.      Relevant past medical, surgical, family and social history reviewed and updated as indicated. Interim medical history since our last visit reviewed. Allergies and  medications reviewed and updated. Outpatient Medications Prior to Visit  Medication Sig Dispense Refill  . amLODipine-benazepril (LOTREL) 5-10 MG capsule Take 1 capsule by mouth daily. 30 capsule 6  . cetirizine (ZYRTEC) 10 MG tablet Take 10 mg by mouth daily.     No facility-administered medications prior to visit.     Per HPI unless specifically indicated in ROS section below Review of Systems Objective:  BP (!) 143/78   Pulse 93   Temp 98.7 F (37.1 C)   Ht 6' 1.5" (1.867 m)   Wt 200 lb (90.7 kg)   BMI 26.03 kg/m   Wt Readings from Last 3 Encounters:  03/27/20 200 lb (90.7 kg)  02/26/20 202 lb 3 oz (91.7 kg)  01/23/20 203 lb 6 oz (92.3 kg)       Physical exam: Gen: alert, NAD, tired but not ill appearing Pulm: speaks in complete sentences without increased work of breathing, intermittent dry cough present Psych: normal mood, normal thought content      Assessment & Plan:   Problem List Items Addressed This Visit    Acute pharyngitis - Primary    Possible COVID vs influenza vs viral pharyngitis. Don't anticipate streptococcal pharyngitis given dry cough present and exudate/LAD absent. Discussed NSAID and tylenol use as well as further supportive care with home remedies. Update if not improving with treatment. Update with results of COVID PCR done today. Pt agrees with plan.  If COVID negative, consider coming in for RST, flu swabs.  No orders of the defined types were placed in this encounter.  No orders of the defined types were placed in this encounter.   I discussed the assessment and treatment plan with the patient. The patient was provided an opportunity to ask questions and all were answered. The patient agreed with the plan and demonstrated an understanding of the instructions. The patient was advised to call back or seek an in-person evaluation if the symptoms worsen or if the condition fails to improve as anticipated.  Follow up plan: Return if  symptoms worsen or fail to improve.  Eustaquio Boyden, MD

## 2020-03-27 NOTE — Telephone Encounter (Signed)
Seen today. 

## 2020-06-24 IMAGING — DX PORTABLE RIGHT ANKLE - 2 VIEW
2 series · 2 of 2 positions shown · non-contrast
Comparison: None.

CLINICAL DATA: Ankle fracture ORIF

EXAM:
PORTABLE RIGHT ANKLE - 2 VIEW

[ankle ap]
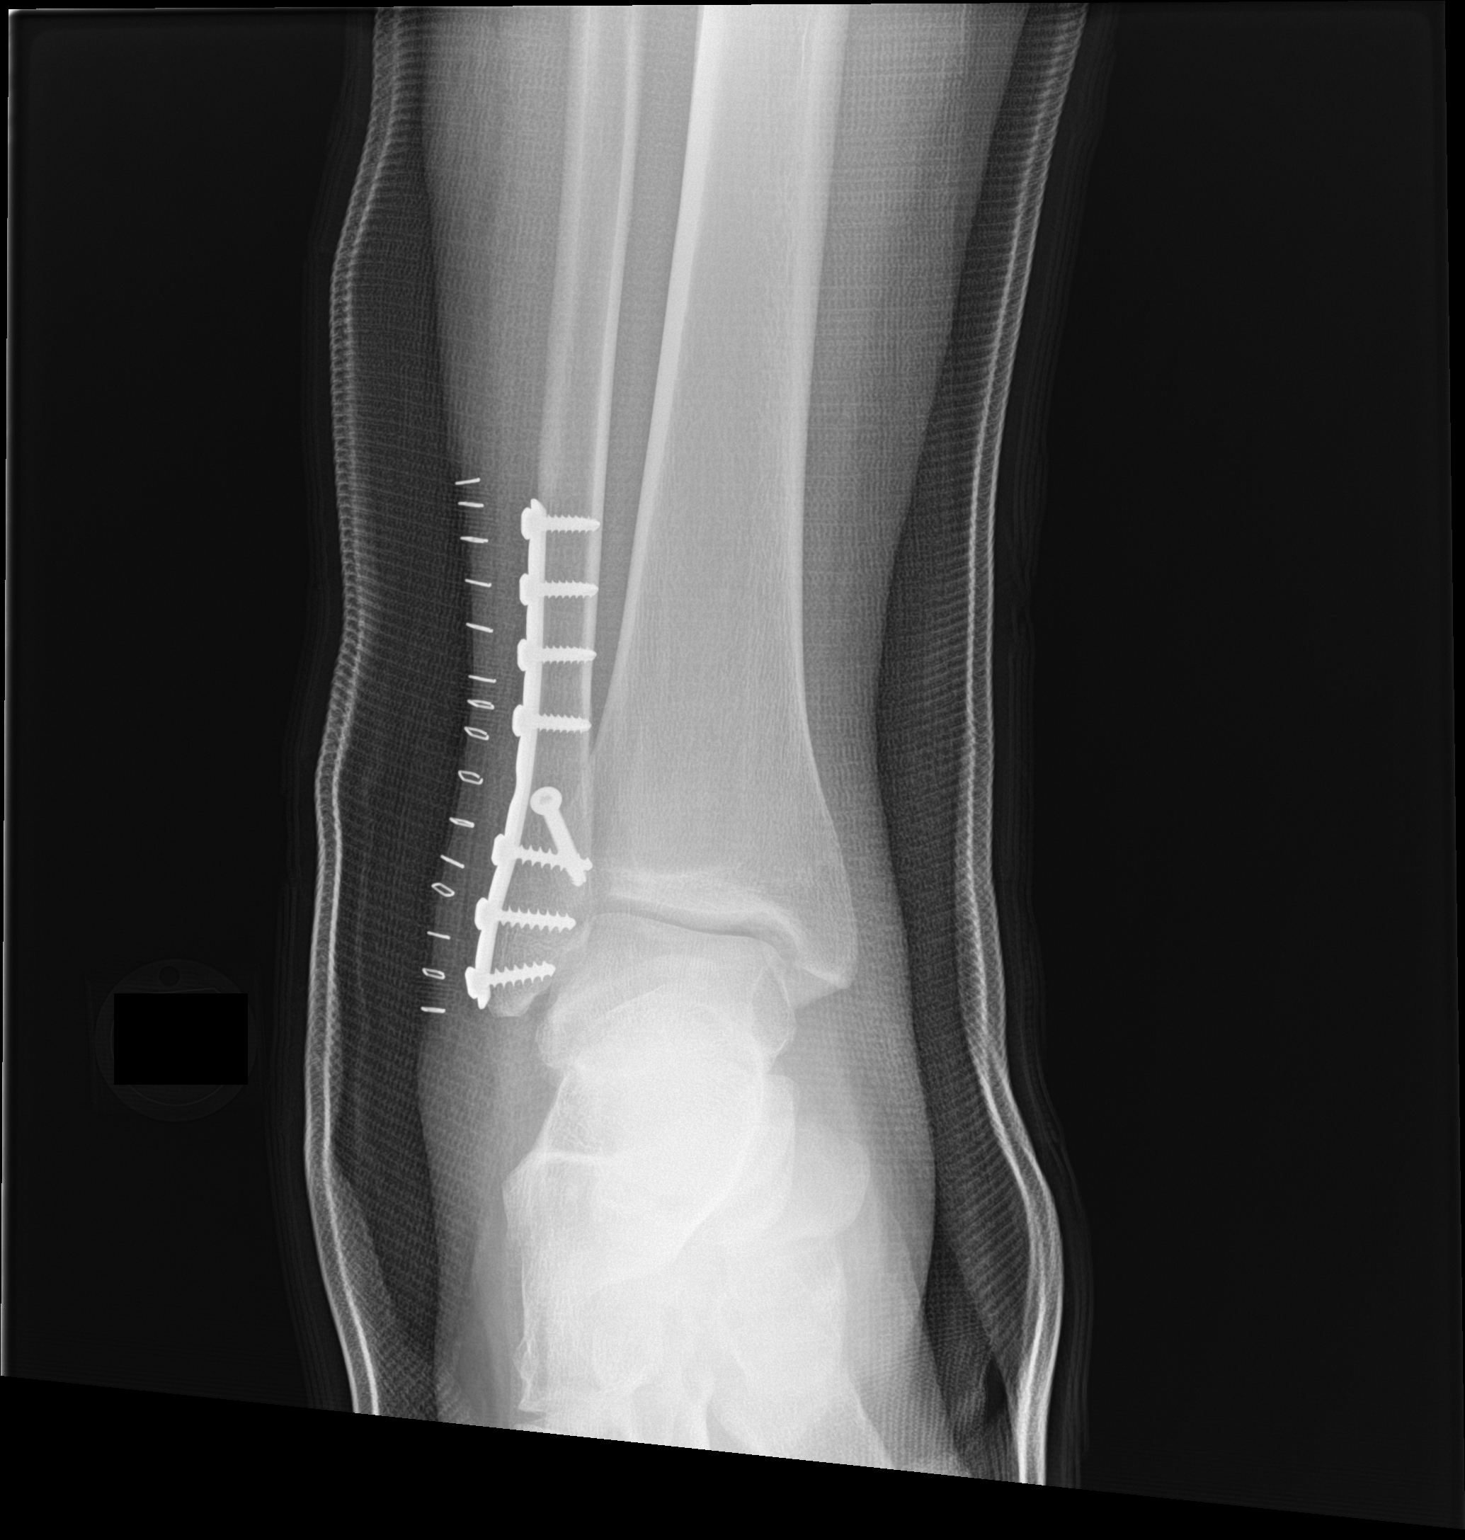

[ankle lat]
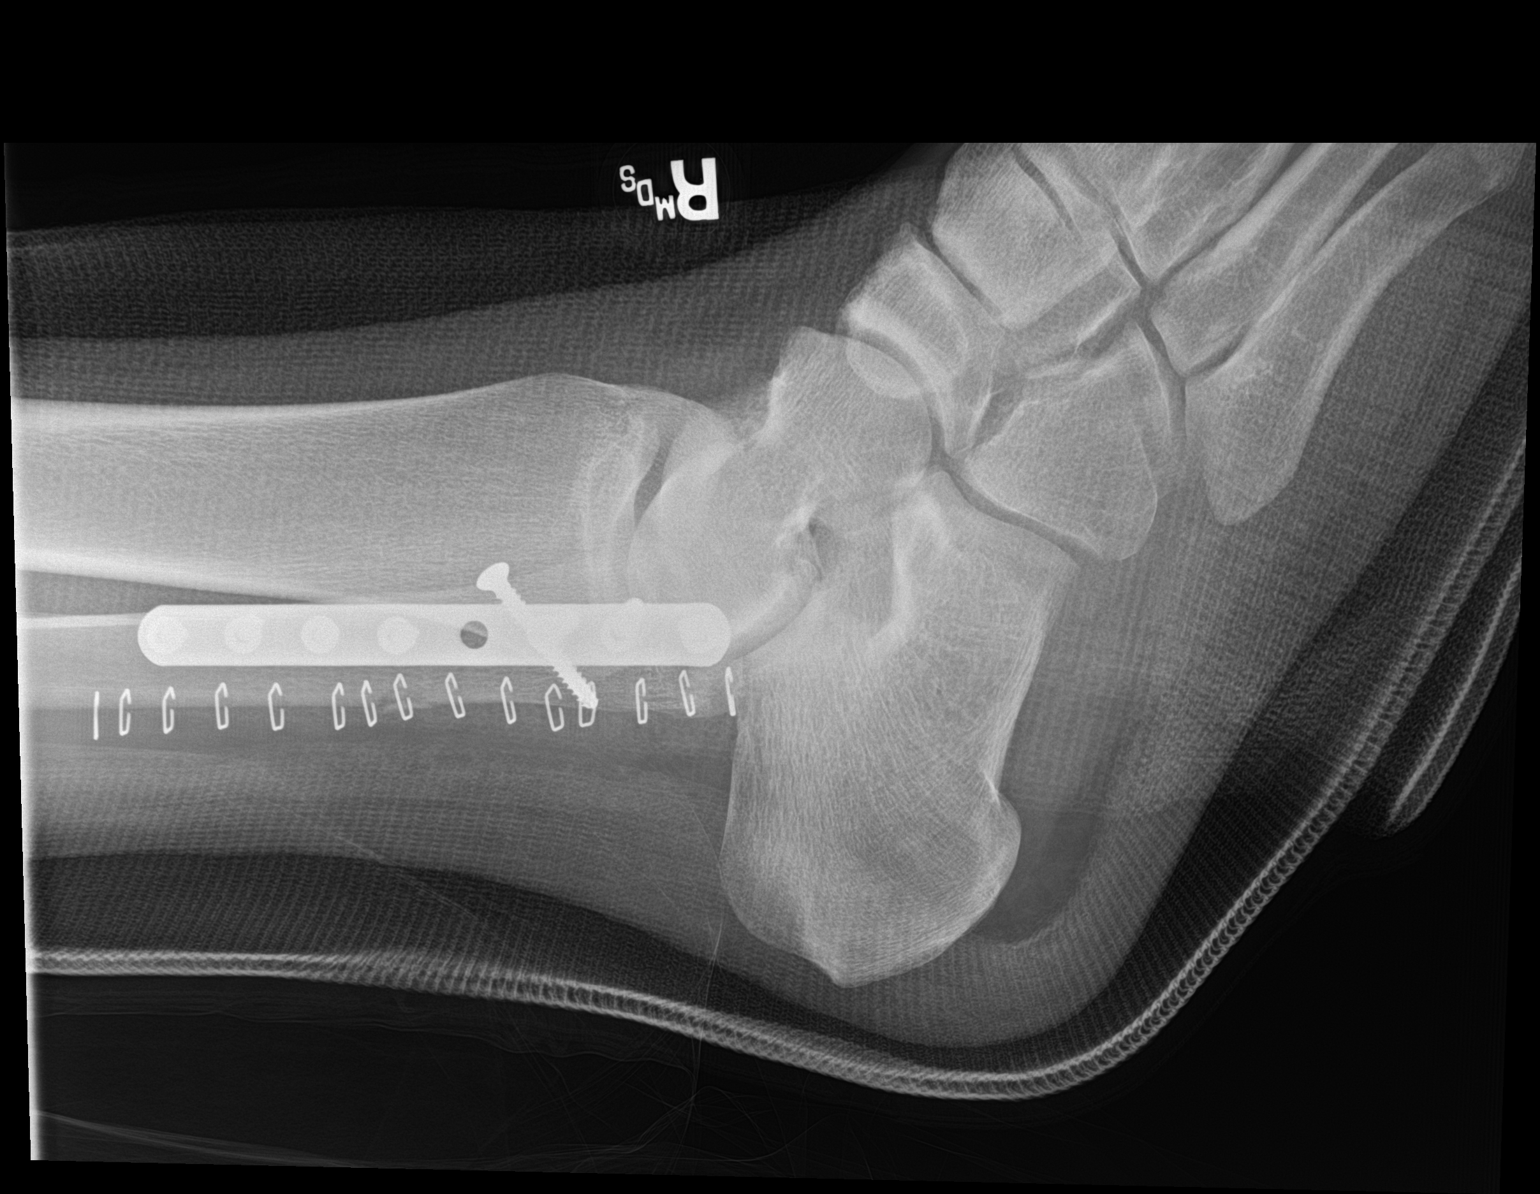

[2 of 2 positions shown; findings below may reference images not displayed]

FINDINGS: Lateral plate and screw fixation of distal fibula. Fracture
alignment anatomic. Ankle joint normal. No fracture of the tibia.
IMPRESSION: Satisfactory ORIF distal fibular fracture.

## 2020-06-26 ENCOUNTER — Telehealth (INDEPENDENT_AMBULATORY_CARE_PROVIDER_SITE_OTHER): Payer: 59 | Admitting: Family Medicine

## 2020-06-26 ENCOUNTER — Encounter: Payer: Self-pay | Admitting: Family Medicine

## 2020-06-26 VITALS — BP 130/86 | Ht 75.0 in | Wt 195.0 lb

## 2020-06-26 DIAGNOSIS — I10 Essential (primary) hypertension: Secondary | ICD-10-CM

## 2020-06-26 DIAGNOSIS — F338 Other recurrent depressive disorders: Secondary | ICD-10-CM

## 2020-06-26 MED ORDER — LOSARTAN POTASSIUM 50 MG PO TABS: 50.0000 mg | ORAL_TABLET | Freq: Every day | ORAL | 3 refills | Status: DC

## 2020-06-26 MED ORDER — CITALOPRAM HYDROBROMIDE 10 MG PO TABS: 10.0000 mg | ORAL_TABLET | Freq: Every day | ORAL | 3 refills | Status: DC

## 2020-06-26 MED ORDER — AMLODIPINE BESYLATE 5 MG PO TABS: 5.0000 mg | ORAL_TABLET | Freq: Every day | ORAL | 3 refills | Status: DC

## 2020-06-26 NOTE — Progress Notes (Signed)
Patient ID: Tony Peck, male    DOB: 01-28-1991, 30 y.o.   MRN: [id number]  Virtual visit completed through MyChart, a video enabled telemedicine application. Due to national recommendations of social distancing due to COVID-19, a virtual visit is felt to be most appropriate for this patient at this time. Reviewed limitations, risks, security and privacy concerns of performing a virtual visit and the availability of in person appointments. I also reviewed that there may be a patient responsible charge related to this service. The patient agreed to proceed.   Patient location: work in his office Provider location: Adult nurse at [facility], office Persons participating in this virtual visit: patient, provider   If any vitals were documented, they were collected by patient at home unless specified below.    BP 130/86 (BP Location: Left Arm, Patient Position: Sitting, Cuff Size: Normal)   Ht 6\' 3"  (1.905 m)   Wt 195 lb (88.5 kg)   BMI 24.37 kg/m   BP Readings from Last 3 Encounters:  06/26/20 130/86  03/27/20 (!) 143/78  02/26/20 130/70    CC: 4 mo HTN f/u visit  Subjective:   HPI: Tony Peck is a 30 y.o. male presenting on 06/26/2020 for Follow-up (x1mos)   HTN - Compliant with current antihypertensive regimen of lotrel 5/10mg  daily.  Tolerating med well without side effects of ankle swelling or. Does check blood pressures at home: well controlled 130/80s. No low blood pressure readings or symptoms of dizziness/syncope. Denies HA, vision changes, CP/tightness, SOB, leg swelling.   Notes dry nagging cough for last several months.   Weight loss noted. Has started running more - walking dog twice daily 1 mile. Increased nausea, anxiety > depression. More irritable, decreased appetite, trouble sleeping. Increased mood lability - can cry without reason. Wonders if related to increased stress at work 11mo at his job). No SI/HI. Notes ongoing cyclical trouble with mood in  winter lonstanding over last several years.  Continues seeing LCSW every Thursday through work - Saturday counselors.      Relevant past medical, surgical, family and social history reviewed and updated as indicated. Interim medical history since our last visit reviewed. Allergies and medications reviewed and updated. Outpatient Medications Prior to Visit  Medication Sig Dispense Refill  . cetirizine (ZYRTEC) 10 MG tablet Take 10 mg by mouth daily.    Sales executive amLODipine-benazepril (LOTREL) 5-10 MG capsule Take 1 capsule by mouth daily. 30 capsule 6   No facility-administered medications prior to visit.     Per HPI unless specifically indicated in ROS section below Review of Systems Objective:  BP 130/86 (BP Location: Left Arm, Patient Position: Sitting, Cuff Size: Normal)   Ht 6\' 3"  (1.905 m)   Wt 195 lb (88.5 kg)   BMI 24.37 kg/m   Wt Readings from Last 3 Encounters:  06/26/20 195 lb (88.5 kg)  03/27/20 200 lb (90.7 kg)  02/26/20 202 lb 3 oz (91.7 kg)       Physical exam: Gen: alert, NAD, not ill appearing Pulm: speaks in complete sentences without increased work of breathing Psych: normal mood, normal thought content      Depression screen [facility] 2/9 06/26/2020 01/23/2020  Decreased Interest 1 0  Down, Depressed, Hopeless 1 1  PHQ - 2 Score 2 1  Altered sleeping 2 2  Tired, decreased energy 3 2  Change in appetite 2 2  Feeling bad or failure about yourself  1 0  Trouble concentrating 3 2  Moving slowly or fidgety/restless 0 1  Suicidal thoughts 0 0  PHQ-9 Score 13 10  Difficult doing work/chores Not difficult at all -    GAD 7 : Generalized Anxiety Score 06/26/2020 01/23/2020  Nervous, Anxious, on Edge 3 2  Control/stop worrying 2 1  Worry too much - different things 2 2  Trouble relaxing 2 2  Restless 2 0  Easily annoyed or irritable 1 1  Afraid - awful might happen 0 0  Total GAD 7 Score 12 8  Anxiety Difficulty Not difficult at all -   Assessment & Plan:    Problem List Items Addressed This Visit    Seasonal affective disorder (HCC)    Worsening depression/anxiety over winter months, noted over last several years. Discussed importance of light exposure. He may look into new counselor through work. Will start celexa 10mg  daily, reviewed common side effects and monitoring for increased suicidality. RTC 2-3 mo f/u visit.       Relevant Medications   citalopram (CELEXA) 10 MG tablet   Essential hypertension    Chronic, great control on current regimen however he notes irritating nagging dry cough since on ACEI.  Will switch lotrel 5/10 to amlodipine 5mg  + losartan 50mg  daily.  Update with effect.       Relevant Medications   amLODipine (NORVASC) 5 MG tablet   losartan (COZAAR) 50 MG tablet       Meds ordered this encounter  Medications  . amLODipine (NORVASC) 5 MG tablet    Sig: Take 1 tablet (5 mg total) by mouth daily.    Dispense:  90 tablet    Refill:  3    To replace lotrel  . losartan (COZAAR) 50 MG tablet    Sig: Take 1 tablet (50 mg total) by mouth daily.    Dispense:  90 tablet    Refill:  3  . citalopram (CELEXA) 10 MG tablet    Sig: Take 1 tablet (10 mg total) by mouth daily.    Dispense:  30 tablet    Refill:  3   No orders of the defined types were placed in this encounter.   I discussed the assessment and treatment plan with the patient. The patient was provided an opportunity to ask questions and all were answered. The patient agreed with the plan and demonstrated an understanding of the instructions. The patient was advised to call back or seek an in-person evaluation if the symptoms worsen or if the condition fails to improve as anticipated.  Follow up plan: No follow-ups on file.  , MD

## 2020-06-26 NOTE — Assessment & Plan Note (Signed)
Worsening depression/anxiety over winter months, noted over last several years. Discussed importance of light exposure. He may look into new counselor through work. Will start celexa 10mg  daily, reviewed common side effects and monitoring for increased suicidality. RTC 2-3 mo f/u visit.

## 2020-06-26 NOTE — Assessment & Plan Note (Signed)
Chronic, great control on current regimen however he notes irritating nagging dry cough since on ACEI.  Will switch lotrel 5/10 to amlodipine 5mg  + losartan 50mg  daily.  Update with effect.

## 2020-09-25 ENCOUNTER — Ambulatory Visit: Payer: 59 | Admitting: Family Medicine

## 2020-09-25 ENCOUNTER — Telehealth: Payer: Self-pay

## 2020-09-25 NOTE — Telephone Encounter (Signed)
Left message on vm per dpr asking pt to call back.  Pt was r/s for OV today at 4:00.  However, we want to make sure it was not supposed to be a MyChart visit.

## 2020-09-28 NOTE — Telephone Encounter (Signed)
Pt was r/s for 10/05/20 at 9:00.

## 2020-09-30 ENCOUNTER — Ambulatory Visit: Payer: 59 | Admitting: Family Medicine

## 2020-10-05 ENCOUNTER — Ambulatory Visit: Payer: 59 | Admitting: Family Medicine

## 2020-10-05 DIAGNOSIS — Z0289 Encounter for other administrative examinations: Secondary | ICD-10-CM

## 2020-10-29 ENCOUNTER — Other Ambulatory Visit: Payer: Self-pay | Admitting: Family Medicine

## 2020-10-29 NOTE — Telephone Encounter (Signed)
Celexa Last filled: 09/24/20, #30 Last OV:  06/26/20, 1 mo HTN f/u Next OV:  None and has missed last 2 OVs

## 2020-11-02 NOTE — Telephone Encounter (Signed)
ERx 

## 2022-06-08 DIAGNOSIS — R0989 Other specified symptoms and signs involving the circulatory and respiratory systems: Secondary | ICD-10-CM | POA: Diagnosis not present

## 2022-06-08 DIAGNOSIS — J029 Acute pharyngitis, unspecified: Secondary | ICD-10-CM | POA: Diagnosis not present

## 2022-06-08 DIAGNOSIS — R059 Cough, unspecified: Secondary | ICD-10-CM | POA: Diagnosis not present

## 2022-06-08 DIAGNOSIS — J40 Bronchitis, not specified as acute or chronic: Secondary | ICD-10-CM | POA: Diagnosis not present

## 2022-08-05 DIAGNOSIS — M545 Low back pain, unspecified: Secondary | ICD-10-CM | POA: Diagnosis not present

## 2022-08-05 DIAGNOSIS — M542 Cervicalgia: Secondary | ICD-10-CM | POA: Diagnosis not present

## 2022-09-14 ENCOUNTER — Encounter: Payer: Self-pay | Admitting: Family Medicine

## 2022-09-14 ENCOUNTER — Ambulatory Visit (INDEPENDENT_AMBULATORY_CARE_PROVIDER_SITE_OTHER): Payer: BC Managed Care – PPO | Admitting: Family Medicine

## 2022-09-14 VITALS — BP 156/82 | HR 93 | Temp 97.7°F | Ht 75.0 in | Wt 212.2 lb

## 2022-09-14 DIAGNOSIS — I10 Essential (primary) hypertension: Secondary | ICD-10-CM | POA: Diagnosis not present

## 2022-09-14 DIAGNOSIS — S76312A Strain of muscle, fascia and tendon of the posterior muscle group at thigh level, left thigh, initial encounter: Secondary | ICD-10-CM | POA: Insufficient documentation

## 2022-09-14 DIAGNOSIS — S76302A Unspecified injury of muscle, fascia and tendon of the posterior muscle group at thigh level, left thigh, initial encounter: Secondary | ICD-10-CM | POA: Diagnosis not present

## 2022-09-14 MED ORDER — AMLODIPINE BESYLATE 5 MG PO TABS: 5.0000 mg | ORAL_TABLET | Freq: Every day | ORAL | 3 refills | Status: DC

## 2022-09-14 NOTE — Assessment & Plan Note (Signed)
BP again elevated - after coming off medications last year.  In setting of increased pain from hamstring injury.  Will restart amlodipine 5mg  daily, low threshold to add ARB.  Advised start monitoring BP at home and let me know if consistently >140/90 to start losartan. Pt agrees with plan.

## 2022-09-14 NOTE — Assessment & Plan Note (Addendum)
Concern for high grade proximal tear given weakness with knee flexion noted on exam today.  Will place urgent referral to orthopedics for further evaluation/management.  He has previously seen [facility] and requests return there.  For now, continue tylenol, ice, rest, leg elevation.

## 2022-09-14 NOTE — Progress Notes (Signed)
Patient ID: Tony Peck, male    DOB: December 31, 1990, 32 y.o.   MRN: [id number]  This visit was conducted in person.  BP (!) 156/82   Pulse 93   Temp 97.7 F (36.5 C) (Temporal)   Ht 6\' 3"  (1.905 m)   Wt 212 lb 4 oz (96.3 kg)   SpO2 97%   BMI 26.53 kg/m   162/90 on repeat testing.   CC: L upper leg pain  Subjective:   HPI: Tony Peck is a 32 y.o. male presenting on 09/14/2022 for Leg Injury (C/o upper L leg pain radiating into L groin. Injured leg on 09/10/22 while dancing. Slipped into a split. )   DOI: 09/10/2022 While dancing at work gala over weekend, slipped on spilt drink and L leg went forward in split. Felt sudden pop/strain. Progressively worsening pain over the next 2 days as well as progressive bruising down leg.  Treating with tylenol and biofreeze. Also using ice.  No previously leg injury.  No numbness or tingling but notes twitching to upper leg.   Chronic hypertension - previously on lotrel 5/10 changed to amlodipine 5mg  + losartan 50mg  daily due to ACEI induced cough, not currently on medication. Denies HA, vision changes, CP/tightness, SOB, leg swelling.   Anxiety improved after going to therapy last year, came off celexa and noted BP has been better controlled.   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      Relevant past medical, surgical, family and social history reviewed and updated as indicated. Interim medical history since our last visit reviewed. Allergies and medications reviewed and updated. Outpatient Medications Prior to Visit  Medication Sig Dispense Refill   cetirizine (ZYRTEC) 10 MG tablet Take 10 mg by mouth daily.     amLODipine (NORVASC) 5 MG tablet Take 1 tablet (5 mg total) by mouth daily. 90 tablet 3   citalopram (CELEXA) 10 MG tablet TAKE 1 TABLET(10 MG) BY MOUTH DAILY 30 tablet 3   losartan (COZAAR) 50 MG tablet Take 1 tablet (50 mg total) by mouth daily. 90 tablet 3   No facility-administered  medications prior to visit.     Per HPI unless specifically indicated in ROS section below Review of Systems  Objective:  BP (!) 156/82   Pulse 93   Temp 97.7 F (36.5 C) (Temporal)   Ht 6\' 3"  (1.905 m)   Wt 212 lb 4 oz (96.3 kg)   SpO2 97%   BMI 26.53 kg/m   Wt Readings from Last 3 Encounters:  09/14/22 212 lb 4 oz (96.3 kg)  06/26/20 195 lb (88.5 kg)  03/27/20 200 lb (90.7 kg)      Physical Exam Vitals and nursing note reviewed.  Constitutional:      Appearance: Normal appearance. He is not ill-appearing.  Musculoskeletal:        General: Swelling and tenderness present.     Right lower leg: No edema.     Left lower leg: No edema.     Comments:  FROM R hip and knee Limited ROM L hip due to discomfort  With patient prone, 5/5 strength hip extension and knee flexion on right, marked weakness with knee flexion on left  Skin:    General: Skin is warm and dry.     Findings: Bruising present.          Comments: Tender bruising present from left medial proximal thigh to knee  Neurological:     Mental Status:  He is alert.  Psychiatric:        Mood and Affect: Mood normal.        Behavior: Behavior normal.        Assessment & Plan:   Problem List Items Addressed This Visit     Essential hypertension    BP again elevated - after coming off medications last year.  In setting of increased pain from hamstring injury.  Will restart amlodipine 5mg  daily, low threshold to add ARB.  Advised start monitoring BP at home and let me know if consistently >140/90 to start losartan. Pt agrees with plan.       Relevant Medications   amLODipine (NORVASC) 5 MG tablet   Tear of left hamstring - Primary    Concern for high grade proximal tear given weakness with knee flexion noted on exam today.  Will place urgent referral to orthopedics for further evaluation/management.  He has previously seen [facility] and requests return there.  For now, continue tylenol, ice,  rest, leg elevation.       Relevant Orders   Ambulatory referral to Orthopedic Surgery     Meds ordered this encounter  Medications   amLODipine (NORVASC) 5 MG tablet    Sig: Take 1 tablet (5 mg total) by mouth daily.    Dispense:  90 tablet    Refill:  3    Orders Placed This Encounter  Procedures   Ambulatory referral to Orthopedic Surgery    Referral Priority:   Urgent    Referral Type:   Surgical    Referral Reason:   Specialty Services Required    Requested Specialty:   Orthopedic Surgery    Number of Visits Requested:   1    Patient Instructions  We will refer you to [facility] for further evaluation of possible higher grade hamstring tear.  BP was too high - start monitoring at home, restart amlodipine 5mg  daily. Let me know if consistently elevated to restart losartan as well. Goal BP <140/90, ideally <130/80 if tolerated well.   Follow up plan: Return if symptoms worsen or fail to improve.  Ria Bush, MD

## 2022-09-14 NOTE — Patient Instructions (Addendum)
We will refer you to [facility] for further evaluation of possible higher grade hamstring tear.  BP was too high - start monitoring at home, restart amlodipine 5mg  daily. Let me know if consistently elevated to restart losartan as well. Goal BP <140/90, ideally <130/80 if tolerated well.

## 2022-09-15 ENCOUNTER — Encounter: Payer: Self-pay | Admitting: Family Medicine

## 2022-09-16 MED ORDER — TRAMADOL HCL 50 MG PO TABS: 50.0000 mg | ORAL_TABLET | Freq: Two times a day (BID) | ORAL | 0 refills | Status: AC | PRN

## 2022-09-21 DIAGNOSIS — M79606 Pain in leg, unspecified: Secondary | ICD-10-CM | POA: Diagnosis not present

## 2022-09-26 DIAGNOSIS — M79606 Pain in leg, unspecified: Secondary | ICD-10-CM | POA: Diagnosis not present

## 2022-09-28 DIAGNOSIS — M79606 Pain in leg, unspecified: Secondary | ICD-10-CM | POA: Diagnosis not present

## 2022-10-05 DIAGNOSIS — M79606 Pain in leg, unspecified: Secondary | ICD-10-CM | POA: Diagnosis not present

## 2022-10-12 DIAGNOSIS — M79606 Pain in leg, unspecified: Secondary | ICD-10-CM | POA: Diagnosis not present

## 2023-05-15 DIAGNOSIS — M25561 Pain in right knee: Secondary | ICD-10-CM | POA: Diagnosis not present

## 2023-05-15 DIAGNOSIS — M25571 Pain in right ankle and joints of right foot: Secondary | ICD-10-CM | POA: Diagnosis not present

## 2023-06-01 DIAGNOSIS — M5416 Radiculopathy, lumbar region: Secondary | ICD-10-CM | POA: Diagnosis not present

## 2023-06-15 ENCOUNTER — Other Ambulatory Visit: Payer: Self-pay | Admitting: Family Medicine

## 2023-06-15 DIAGNOSIS — I10 Essential (primary) hypertension: Secondary | ICD-10-CM

## 2023-06-15 NOTE — Telephone Encounter (Signed)
E-scribed refill  Plz schedule CPE and fasting lab (no food/drink- except water and/or blk coffee 5 hrs prior) visits for additional refills.  

## 2023-06-16 NOTE — Telephone Encounter (Signed)
Vm box was full, sent mychart message

## 2023-06-23 DIAGNOSIS — M545 Low back pain, unspecified: Secondary | ICD-10-CM | POA: Diagnosis not present

## 2023-06-23 DIAGNOSIS — M25551 Pain in right hip: Secondary | ICD-10-CM | POA: Diagnosis not present

## 2023-06-23 DIAGNOSIS — M25561 Pain in right knee: Secondary | ICD-10-CM | POA: Diagnosis not present

## 2023-07-26 ENCOUNTER — Telehealth: Payer: BC Managed Care – PPO | Admitting: Physician Assistant

## 2023-07-26 DIAGNOSIS — R6889 Other general symptoms and signs: Secondary | ICD-10-CM

## 2023-07-27 MED ORDER — OSELTAMIVIR PHOSPHATE 75 MG PO CAPS: 75.0000 mg | ORAL_CAPSULE | Freq: Two times a day (BID) | ORAL | 0 refills | Status: AC

## 2023-07-27 NOTE — Addendum Note (Signed)
 Addended by: Lanetta Pion on: 07/27/2023 10:53 AM   Modules accepted: Orders

## 2023-07-27 NOTE — Progress Notes (Signed)
 E visit for Flu like symptoms   We are sorry that you are not feeling well.  Here is how we plan to help! Based on what you have shared with me it looks like you may have flu-like symptoms that should be watched but do not seem to indicate anti-viral treatment.  Influenza or "the flu" is   an infection caused by a respiratory virus. The flu virus is highly contagious and persons who did not receive their yearly flu vaccination may "catch" the flu from close contact.  We have anti-viral medications to treat the viruses that cause this infection. They are not a "cure" and only shorten the course of the infection. These prescriptions are most effective when they are given within the first 2 days of "flu" symptoms. Antiviral medication are indicated if you have a high risk of complications from the flu. You should  also consider an antiviral medication if you are in close contact with someone who is at risk. These medications can help patients avoid complications from the flu  but have side effects that you should know. Possible side effects from Tamiflu or oseltamivir include nausea, vomiting, diarrhea, dizziness, headaches, eye redness, sleep problems or other respiratory symptoms. You should not take Tamiflu if you have an allergy to oseltamivir or any to the ingredients in Tamiflu.  Based upon your symptoms and potential risk factors I recommend that you follow the flu symptoms recommendation that I have listed below.  ANYONE WHO HAS FLU SYMPTOMS SHOULD: Stay home. The flu is highly contagious and going out or to work exposes others! Be sure to drink plenty of fluids. Water is fine as well as fruit juices, sodas and electrolyte beverages. You may want to stay away from caffeine or alcohol. If you are nauseated, try taking small sips of liquids. How do you know if you are getting enough fluid? Your urine should be a pale yellow or almost colorless. Get rest. Taking a steamy shower or using a  humidifier may help nasal congestion and ease sore throat pain. Using a saline nasal spray works much the same way. Cough drops, hard candies and sore throat lozenges may ease your cough. Line up a caregiver. Have someone check on you regularly.   GET HELP RIGHT AWAY IF: You cannot keep down liquids or your medications. You become short of breath Your fell like you are going to pass out or loose consciousness. Your symptoms persist after you have completed your treatment plan MAKE SURE YOU  Understand these instructions. Will watch your condition. Will get help right away if you are not doing well or get worse.  Your e-visit answers were reviewed by a board certified advanced clinical practitioner to complete your personal care plan.  Depending on the condition, your plan could have included both over the counter or prescription medications.  If there is a problem please reply  once you have received a response from your provider.  Your safety is important to [place].  If you have drug allergies check your prescription carefully.    You can use MyChart to ask questions about today's visit, request a non-urgent call back, or ask for a work or school excuse for 24 hours related to this e-Visit. If it has been greater than 24 hours you will need to follow up with your provider, or enter a new e-Visit to address those concerns.  You will get an e-mail in the next two days asking about your experience.  I hope that  your e-visit has been valuable and will speed your recovery. Thank you for using e-visits.  I have spent 5 minutes in review of e-visit questionnaire, review and updating patient chart, medical decision making and response to patient.   Kasandra Knudsen Mayers, PA-C

## 2023-11-21 DIAGNOSIS — F900 Attention-deficit hyperactivity disorder, predominantly inattentive type: Secondary | ICD-10-CM | POA: Diagnosis not present

## 2023-12-26 DIAGNOSIS — F900 Attention-deficit hyperactivity disorder, predominantly inattentive type: Secondary | ICD-10-CM | POA: Diagnosis not present
